# Patient Record
Sex: Male | Born: 1960 | Race: Black or African American | Hispanic: No | Marital: Single | Smoking: Never smoker
Health system: Southern US, Community
[De-identification: ages and names within clinical notes are randomized; demographics above are authoritative.]

## PROBLEM LIST (undated history)

## (undated) DIAGNOSIS — C61 Malignant neoplasm of prostate: Secondary | ICD-10-CM

## (undated) DIAGNOSIS — F1010 Alcohol abuse, uncomplicated: Secondary | ICD-10-CM

## (undated) DIAGNOSIS — C801 Malignant (primary) neoplasm, unspecified: Secondary | ICD-10-CM

## (undated) HISTORY — PX: APPENDECTOMY: SHX54

## (undated) HISTORY — PX: PROSTATECTOMY: SHX69

---

## 2018-11-17 ENCOUNTER — Other Ambulatory Visit: Payer: Self-pay | Admitting: *Deleted

## 2018-11-17 DIAGNOSIS — Z20822 Contact with and (suspected) exposure to covid-19: Secondary | ICD-10-CM

## 2018-11-19 LAB — NOVEL CORONAVIRUS, NAA: SARS-CoV-2, NAA: NOT DETECTED

## 2019-01-15 ENCOUNTER — Other Ambulatory Visit: Payer: Self-pay

## 2019-01-15 DIAGNOSIS — Z20822 Contact with and (suspected) exposure to covid-19: Secondary | ICD-10-CM

## 2019-01-16 LAB — NOVEL CORONAVIRUS, NAA: SARS-CoV-2, NAA: NOT DETECTED

## 2020-10-12 DIAGNOSIS — H5203 Hypermetropia, bilateral: Secondary | ICD-10-CM | POA: Diagnosis not present

## 2021-05-07 ENCOUNTER — Emergency Department (HOSPITAL_COMMUNITY)
Admission: EM | Admit: 2021-05-07 | Discharge: 2021-05-07 | Disposition: A | Discharge status: Home / Self Care | Payer: No Typology Code available for payment source | Attending: Emergency Medicine | Admitting: Emergency Medicine

## 2021-05-07 ENCOUNTER — Other Ambulatory Visit: Payer: Self-pay

## 2021-05-07 ENCOUNTER — Encounter (HOSPITAL_COMMUNITY): Payer: Self-pay

## 2021-05-07 DIAGNOSIS — Z859 Personal history of malignant neoplasm, unspecified: Secondary | ICD-10-CM | POA: Insufficient documentation

## 2021-05-07 DIAGNOSIS — S61216A Laceration without foreign body of right little finger without damage to nail, initial encounter: Secondary | ICD-10-CM

## 2021-05-07 DIAGNOSIS — Z23 Encounter for immunization: Secondary | ICD-10-CM | POA: Insufficient documentation

## 2021-05-07 DIAGNOSIS — Y93E5 Activity, floor mopping and cleaning: Secondary | ICD-10-CM | POA: Diagnosis not present

## 2021-05-07 DIAGNOSIS — W268XXA Contact with other sharp object(s), not elsewhere classified, initial encounter: Secondary | ICD-10-CM | POA: Diagnosis not present

## 2021-05-07 DIAGNOSIS — Y990 Civilian activity done for income or pay: Secondary | ICD-10-CM | POA: Diagnosis not present

## 2021-05-07 HISTORY — DX: Malignant (primary) neoplasm, unspecified: C80.1

## 2021-05-07 MED ORDER — LIDOCAINE HCL (PF) 1 % IJ SOLN
5.0000 mL | Freq: Once | INTRAMUSCULAR | Status: AC
  Administered 2021-05-07: 17:00:00 5 mL
  Filled 2021-05-07: qty 30

## 2021-05-07 MED ORDER — TETANUS-DIPHTH-ACELL PERTUSSIS 5-2.5-18.5 LF-MCG/0.5 IM SUSY
0.5000 mL | PREFILLED_SYRINGE | Freq: Once | INTRAMUSCULAR | Status: AC
  Administered 2021-05-07: 16:00:00 0.5 mL via INTRAMUSCULAR
  Filled 2021-05-07: qty 0.5

## 2021-05-07 MED ORDER — CEPHALEXIN 500 MG PO CAPS: 500.0000 mg | ORAL_CAPSULE | Freq: Four times a day (QID) | ORAL | 0 refills | Status: DC

## 2021-05-07 NOTE — ED Triage Notes (Signed)
Patient reports that he was using a scraper and dropped a the scraper and then he tried catching it and received a laceration to the right pinky. Bleeding controlled.

## 2021-05-07 NOTE — ED Provider Notes (Signed)
Emergency Medicine Provider Triage Evaluation Note  Kyle Chang , a 60 y.o. male  was evaluated in triage.  Pt complains of right pinky laceration that occurred at around 2 PM today.  Review of Systems  Positive: Laceration Negative:   Physical Exam  BP (!) 193/90 (BP Location: Left Arm)   Pulse (!) 110   Temp 98.9 F (37.2 C) (Oral)   Resp 18   Ht 5\' 8"  (1.727 m)   Wt 117.9 kg   SpO2 99%   BMI 39.53 kg/m  Gen:   Awake, no distress   Resp:  Normal effort  MSK:   Moves extremities without difficulty  Other:  Laceration distal to lateral right PIP joint on right pinky patient with full range of motion in all digits on right hand, sensation intact  Medical Decision Making  Medically screening exam initiated at 3:23 PM.  Appropriate orders placed.  Kyle Chang was informed that the remainder of the evaluation will be completed by another provider, this initial triage assessment does not replace that evaluation, and the importance of remaining in the ED until their evaluation is complete.     Evlyn Courier, PA-C 05/07/21 1525    Pattricia Boss, MD 05/07/21 [phone]

## 2021-05-07 NOTE — ED Provider Notes (Signed)
[facility] DEPT Provider Note   CSN: [id number] Arrival date & time: 05/07/21  1430     History Chief Complaint  Patient presents with   Laceration    Kyle Chang is a 60 y.o. male.  HPI Patient is a 60 year old male who presents to the emergency department due to a laceration to the right pinky.  This occurred just prior to arrival.  Patient states that he was using a sharp floor scraper to clean a spot of dried glue and grabbed the scraper blade and accidentally cut the finger.  No active bleeding at this time.  Denies any numbness or weakness.  Unsure of the timing of his last Tdap.    Past Medical History:  Diagnosis Date   Cancer ([place])     There are no problems to display for this patient.   Past Surgical History:  Procedure Laterality Date   APPENDECTOMY     PROSTATECTOMY         Family History  Problem Relation Age of Onset   Cerebral aneurysm Mother    Cancer Father     Social History   Tobacco Use   Smoking status: Never   Smokeless tobacco: Never  Vaping Use   Vaping Use: Never used  Substance Use Topics   Alcohol use: Yes   Drug use: Never    Home Medications Prior to Admission medications   Medication Sig Start Date End Date Taking? Authorizing Provider  cephALEXin (KEFLEX) 500 MG capsule Take 1 capsule (500 mg total) by mouth 4 (four) times daily. 05/07/21  Yes Rayna Sexton, PA-C    Allergies    Patient has no known allergies.  Review of Systems   Review of Systems  Musculoskeletal:  Positive for arthralgias and myalgias.  Skin:  Positive for wound.  Neurological:  Negative for weakness and numbness.   Physical Exam Updated Vital Signs BP (!) 171/98   Pulse 93   Temp 98.9 F (37.2 C) (Oral)   Resp 16   Ht 5\' 8"  (1.727 m)   Wt 117.9 kg   SpO2 100%   BMI 39.53 kg/m   Physical Exam Vitals and nursing note reviewed.  Constitutional:      General: He is not in acute distress.     Appearance: He is well-developed.  HENT:     Head: Normocephalic and atraumatic.     Right Ear: External ear normal.     Left Ear: External ear normal.  Eyes:     General: No scleral icterus.       Right eye: No discharge.        Left eye: No discharge.     Conjunctiva/sclera: Conjunctivae normal.  Neck:     Trachea: No tracheal deviation.  Cardiovascular:     Rate and Rhythm: Normal rate.  Pulmonary:     Effort: Pulmonary effort is normal. No respiratory distress.     Breath sounds: No stridor.  Abdominal:     General: There is no distension.  Musculoskeletal:        General: No swelling or deformity.     Cervical back: Neck supple.  Skin:    General: Skin is warm and dry.     Findings: No rash.     Comments: 2.5 cm well approximated avulsion laceration noted to the palmar aspect of the right pinky.  No active bleeding.  Good cap refill in the finger.  Distal sensation intact.  2+ radial pulses.  Full range of  motion of the finger.  Neurological:     Mental Status: He is alert.     Cranial Nerves: Cranial nerve deficit: no gross deficits.   ED Results / Procedures / Treatments   Labs (all labs ordered are listed, but only abnormal results are displayed) Labs Reviewed - No data to display  EKG None  Radiology No results found.  Procedures .Marland KitchenLaceration Repair  Date/Time: 05/07/2021 5:44 PM Performed by: Rayna Sexton, PA-C Authorized by: Rayna Sexton, PA-C   Consent:    Consent obtained:  Verbal   Consent given by:  Patient   Risks discussed:  Infection, need for additional repair, pain, poor cosmetic result and poor wound healing   Alternatives discussed:  No treatment and delayed treatment Universal protocol:    Procedure explained and questions answered to patient or proxy's satisfaction: yes     Relevant documents present and verified: yes     Test results available: yes     Imaging studies available: yes     Required blood products, implants,  devices, and special equipment available: yes     Site/side marked: yes     Immediately prior to procedure, a time out was called: yes     Patient identity confirmed:  Verbally with patient Anesthesia:    Anesthesia method:  Local infiltration Laceration details:    Location:  Finger   Finger location:  R small finger   Length (cm):  2.5 Pre-procedure details:    Preparation:  Patient was prepped and draped in usual sterile fashion Exploration:    Imaging outcome: foreign body not noted     Wound exploration: wound explored through full range of motion     Contaminated: no   Treatment:    Area cleansed with:  Soap and water, Shur-Clens and povidone-iodine   Amount of cleaning:  Extensive   Irrigation method:  Pressure wash Skin repair:    Repair method:  Sutures   Suture size:  4-0   Suture material:  Prolene   Suture technique:  Simple interrupted   Number of sutures:  3 Approximation:    Approximation:  Close Repair type:    Repair type:  Simple Post-procedure details:    Dressing:  Non-adherent dressing   Procedure completion:  Tolerated well, no immediate complications   Medications Ordered in ED Medications  Tdap (BOOSTRIX) injection 0.5 mL (0.5 mLs Intramuscular Given 05/07/21 1602)  lidocaine (PF) (XYLOCAINE) 1 % injection 5 mL (5 mLs Infiltration Given 05/07/21 1651)   ED Course  I have reviewed the triage vital signs and the nursing notes.  Pertinent labs & imaging results that were available during my care of the patient were reviewed by me and considered in my medical decision making (see chart for details).    MDM Rules/Calculators/A&P                          Pt is a 60 y.o. male who presents to the emergency department due to a laceration to the right pinky.  Patient with a 2.5 cm avulsion laceration to the right pinky.  No active bleeding.  Neurovascularly intact distal to the injury.  Full range of motion of the finger.  Does not appear to be any  tendon involvement.  Wound explored through full range of motion and cleaned extensively.  3, 4-0 Prolene sutures were placed.  Patient tolerated the procedure well.  Please see procedure note above for additional details.  Feel the  patient is stable for discharge at this time and he is agreeable.  Given the nature of the injury will discharge on a course of Keflex to help prevent infection.  Discussed wound care.  Suture removal in 7 to 10 days.  His questions were answered and he was amicable at the time of discharge.  Note: Portions of this report may have been transcribed using voice recognition software. Every effort was made to ensure accuracy; however, inadvertent computerized transcription errors may be present.   Final Clinical Impression(s) / ED Diagnoses Final diagnoses:  Laceration of right little finger without foreign body without damage to nail, initial encounter   Rx / DC Orders ED Discharge Orders          Ordered    cephALEXin (KEFLEX) 500 MG capsule  4 times daily        05/07/21 1738             Rayna Sexton, PA-C 05/07/21 1748    Fredia Sorrow, MD 05/10/21 [phone]

## 2021-05-07 NOTE — Discharge Instructions (Addendum)
Like we discussed, please continue to apply antibiotic ointment to the wound 1-2 times per day.  Please have your stitches removed in 7 to 10 days.  I am prescribing you an antibiotic called Keflex.  Please take this 4 times a day for the next 5 days.  Take it with a small amount of food to prevent stomach irritation.  Do not stop taking it early.  If you develop any new or worsening swelling, pain, redness, fevers, vomiting, discharge from the wound, please come back to the emergency department immediately.

## 2021-07-05 ENCOUNTER — Inpatient Hospital Stay (HOSPITAL_COMMUNITY)
Admission: EM | Admit: 2021-07-05 | Discharge: 2021-07-06 | DRG: 065 | Disposition: A | Discharge status: Home / Self Care | Payer: BC Managed Care – PPO | Attending: Internal Medicine | Admitting: Internal Medicine

## 2021-07-05 ENCOUNTER — Encounter (HOSPITAL_COMMUNITY): Payer: Self-pay

## 2021-07-05 ENCOUNTER — Emergency Department (HOSPITAL_COMMUNITY): Payer: BC Managed Care – PPO

## 2021-07-05 ENCOUNTER — Inpatient Hospital Stay (HOSPITAL_COMMUNITY): Payer: BC Managed Care – PPO

## 2021-07-05 ENCOUNTER — Other Ambulatory Visit: Payer: Self-pay

## 2021-07-05 DIAGNOSIS — I10 Essential (primary) hypertension: Secondary | ICD-10-CM | POA: Diagnosis present

## 2021-07-05 DIAGNOSIS — Z800 Family history of malignant neoplasm of digestive organs: Secondary | ICD-10-CM

## 2021-07-05 DIAGNOSIS — I63532 Cerebral infarction due to unspecified occlusion or stenosis of left posterior cerebral artery: Principal | ICD-10-CM | POA: Diagnosis present

## 2021-07-05 DIAGNOSIS — Z8546 Personal history of malignant neoplasm of prostate: Secondary | ICD-10-CM | POA: Diagnosis not present

## 2021-07-05 DIAGNOSIS — G8191 Hemiplegia, unspecified affecting right dominant side: Secondary | ICD-10-CM | POA: Diagnosis not present

## 2021-07-05 DIAGNOSIS — E669 Obesity, unspecified: Secondary | ICD-10-CM | POA: Diagnosis present

## 2021-07-05 DIAGNOSIS — R29701 NIHSS score 1: Secondary | ICD-10-CM | POA: Diagnosis not present

## 2021-07-05 DIAGNOSIS — I6381 Other cerebral infarction due to occlusion or stenosis of small artery: Secondary | ICD-10-CM | POA: Diagnosis present

## 2021-07-05 DIAGNOSIS — N400 Enlarged prostate without lower urinary tract symptoms: Secondary | ICD-10-CM | POA: Diagnosis present

## 2021-07-05 DIAGNOSIS — D164 Benign neoplasm of bones of skull and face: Secondary | ICD-10-CM | POA: Diagnosis not present

## 2021-07-05 DIAGNOSIS — I639 Cerebral infarction, unspecified: Secondary | ICD-10-CM

## 2021-07-05 DIAGNOSIS — F1010 Alcohol abuse, uncomplicated: Secondary | ICD-10-CM | POA: Diagnosis present

## 2021-07-05 DIAGNOSIS — Z20822 Contact with and (suspected) exposure to covid-19: Secondary | ICD-10-CM | POA: Diagnosis not present

## 2021-07-05 DIAGNOSIS — Z6838 Body mass index (BMI) 38.0-38.9, adult: Secondary | ICD-10-CM | POA: Diagnosis not present

## 2021-07-05 DIAGNOSIS — R29818 Other symptoms and signs involving the nervous system: Secondary | ICD-10-CM | POA: Diagnosis not present

## 2021-07-05 DIAGNOSIS — R200 Anesthesia of skin: Secondary | ICD-10-CM | POA: Diagnosis not present

## 2021-07-05 DIAGNOSIS — Z9079 Acquired absence of other genital organ(s): Secondary | ICD-10-CM

## 2021-07-05 DIAGNOSIS — Z79899 Other long term (current) drug therapy: Secondary | ICD-10-CM

## 2021-07-05 DIAGNOSIS — E7800 Pure hypercholesterolemia, unspecified: Secondary | ICD-10-CM | POA: Diagnosis not present

## 2021-07-05 DIAGNOSIS — E785 Hyperlipidemia, unspecified: Secondary | ICD-10-CM | POA: Diagnosis present

## 2021-07-05 HISTORY — DX: Alcohol abuse, uncomplicated: F10.10

## 2021-07-05 HISTORY — DX: Malignant neoplasm of prostate: C61

## 2021-07-05 HISTORY — DX: Obesity, unspecified: E66.9

## 2021-07-05 LAB — CBC
HCT: 40.4 % (ref 39.0–52.0)
Hemoglobin: 13.5 g/dL (ref 13.0–17.0)
MCH: 29.7 pg (ref 26.0–34.0)
MCHC: 33.4 g/dL (ref 30.0–36.0)
MCV: 88.8 fL (ref 80.0–100.0)
Platelets: 286 10*3/uL (ref 150–400)
RBC: 4.55 MIL/uL (ref 4.22–5.81)
RDW: 13.4 % (ref 11.5–15.5)
WBC: 5.7 10*3/uL (ref 4.0–10.5)
nRBC: 0 % (ref 0.0–0.2)

## 2021-07-05 LAB — ETHANOL: Alcohol, Ethyl (B): <10 mg/dL (ref <10)

## 2021-07-05 LAB — COMPREHENSIVE METABOLIC PANEL
ALT: 16 U/L (ref 0–44)
AST: 16 U/L (ref 15–41)
Albumin: 3.7 g/dL (ref 3.5–5.0)
Alkaline Phosphatase: 103 U/L (ref 38–126)
Anion gap: 8 (ref 5–15)
BUN: 10 mg/dL (ref 8–23)
CO2: 27 mmol/L (ref 22–32)
Calcium: 8.4 mg/dL — ABNORMAL LOW (ref 8.9–10.3)
Chloride: 102 mmol/L (ref 98–111)
Creatinine, Ser: 0.67 mg/dL (ref 0.61–1.24)
GFR, Estimated: >60 mL/min (ref >60)
Glucose, Bld: 108 mg/dL — ABNORMAL HIGH (ref 70–99)
Potassium: 3.7 mmol/L (ref 3.5–5.1)
Sodium: 137 mmol/L (ref 135–145)
Total Bilirubin: 0.9 mg/dL (ref 0.3–1.2)
Total Protein: 7.3 g/dL (ref 6.5–8.1)

## 2021-07-05 LAB — RAPID URINE DRUG SCREEN, HOSP PERFORMED
Amphetamines: NOT DETECTED
Barbiturates: NOT DETECTED
Benzodiazepines: NOT DETECTED
Cocaine: NOT DETECTED
Opiates: NOT DETECTED
Tetrahydrocannabinol: NOT DETECTED

## 2021-07-05 LAB — TSH: TSH: 0.864 u[IU]/mL (ref 0.350–4.500)

## 2021-07-05 LAB — URINALYSIS, ROUTINE W REFLEX MICROSCOPIC
Bilirubin Urine: NEGATIVE
Glucose, UA: NEGATIVE mg/dL
Hgb urine dipstick: NEGATIVE
Ketones, ur: NEGATIVE mg/dL
Leukocytes,Ua: NEGATIVE
Nitrite: NEGATIVE
Protein, ur: NEGATIVE mg/dL
Specific Gravity, Urine: 1.013 (ref 1.005–1.030)
pH: 6 (ref 5.0–8.0)

## 2021-07-05 LAB — DIFFERENTIAL
Abs Immature Granulocytes: 0.02 10*3/uL (ref 0.00–0.07)
Basophils Absolute: 0 10*3/uL (ref 0.0–0.1)
Basophils Relative: 0 %
Eosinophils Absolute: 0.1 10*3/uL (ref 0.0–0.5)
Eosinophils Relative: 2 %
Immature Granulocytes: 0 %
Lymphocytes Relative: 31 %
Lymphs Abs: 1.8 10*3/uL (ref 0.7–4.0)
Monocytes Absolute: 0.4 10*3/uL (ref 0.1–1.0)
Monocytes Relative: 7 %
Neutro Abs: 3.5 10*3/uL (ref 1.7–7.7)
Neutrophils Relative %: 60 %

## 2021-07-05 LAB — I-STAT CHEM 8, ED
BUN: 10 mg/dL (ref 8–23)
Calcium, Ion: 1.11 mmol/L — ABNORMAL LOW (ref 1.15–1.40)
Chloride: 102 mmol/L (ref 98–111)
Creatinine, Ser: 0.8 mg/dL (ref 0.61–1.24)
Glucose, Bld: 106 mg/dL — ABNORMAL HIGH (ref 70–99)
HCT: 40 % (ref 39.0–52.0)
Hemoglobin: 13.6 g/dL (ref 13.0–17.0)
Potassium: 4.3 mmol/L (ref 3.5–5.1)
Sodium: 139 mmol/L (ref 135–145)
TCO2: 29 mmol/L (ref 22–32)

## 2021-07-05 LAB — PROTIME-INR
INR: 1 (ref 0.8–1.2)
Prothrombin Time: 13.3 seconds (ref 11.4–15.2)

## 2021-07-05 LAB — PHOSPHORUS: Phosphorus: 2.9 mg/dL (ref 2.5–4.6)

## 2021-07-05 LAB — APTT: aPTT: 27 seconds (ref 24–36)

## 2021-07-05 LAB — RESP PANEL BY RT-PCR (FLU A&B, COVID) ARPGX2
Influenza A by PCR: NEGATIVE
Influenza B by PCR: NEGATIVE
SARS Coronavirus 2 by RT PCR: NEGATIVE

## 2021-07-05 LAB — MAGNESIUM: Magnesium: 2 mg/dL (ref 1.7–2.4)

## 2021-07-05 IMAGING — MR MR MRA HEAD W/O CM
1 series · 19 of 48 positions shown · non-contrast
Comparison: MRI earlier same day

CLINICAL DATA: Neuro deficit, acute, stroke suspected. Acute left
thalamic infarction by previous MRI.

EXAM:
MRA HEAD WITHOUT CONTRAST
TECHNIQUE: Angiographic images of the Circle of Willis were acquired using MRA
technique without intravenous contrast.

[Series 8: TOF · axial · 0.6mm · 0.35mm/px · z∈[-25,+70]mm · 19 of 172 slices shown]
[im 1/172]
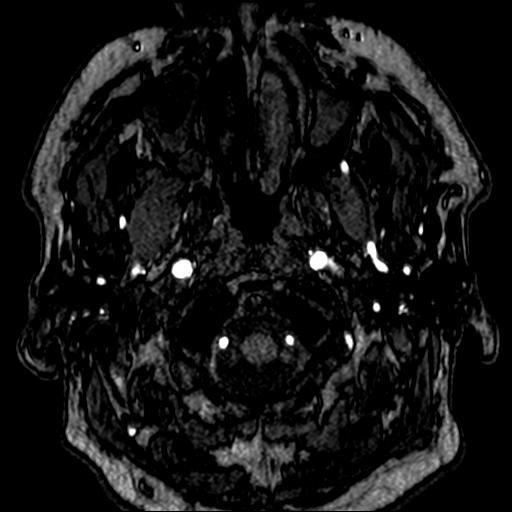
[im 4/172]
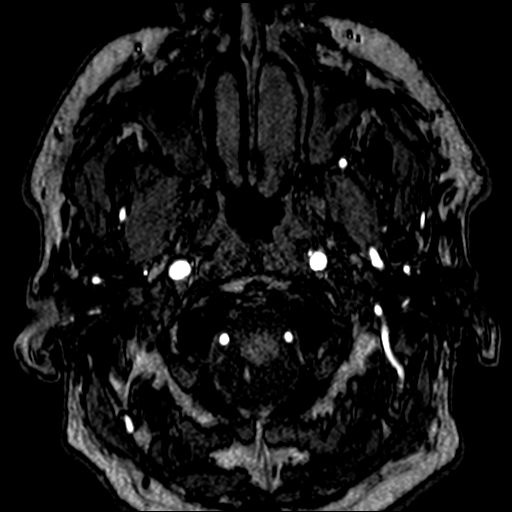
[im 8/172]
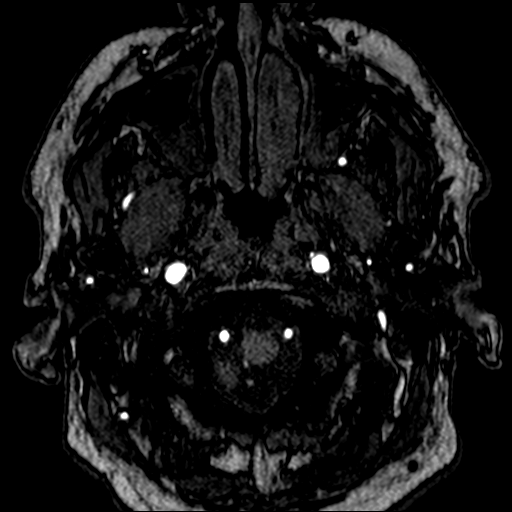
[im 11/172]
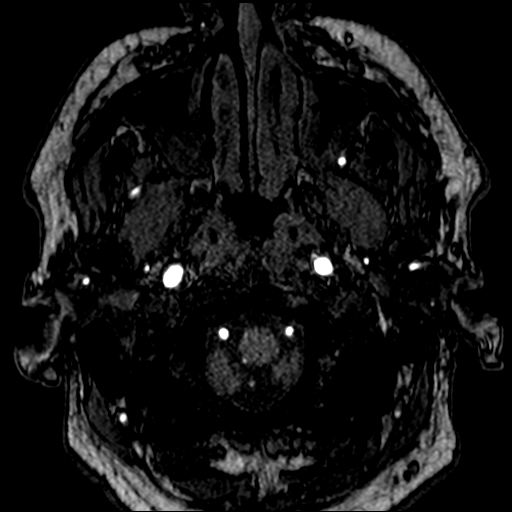
[im 15/172]
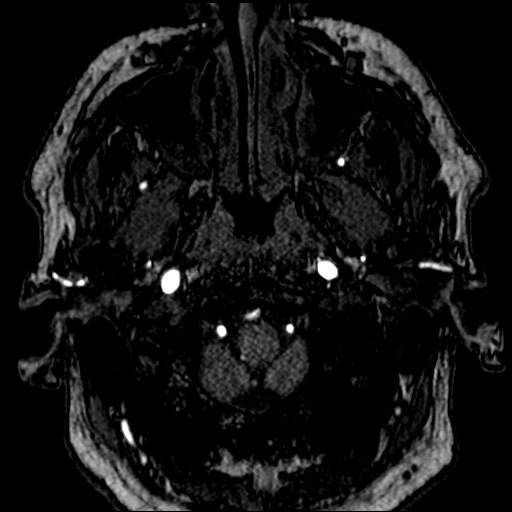
[im 19/172]
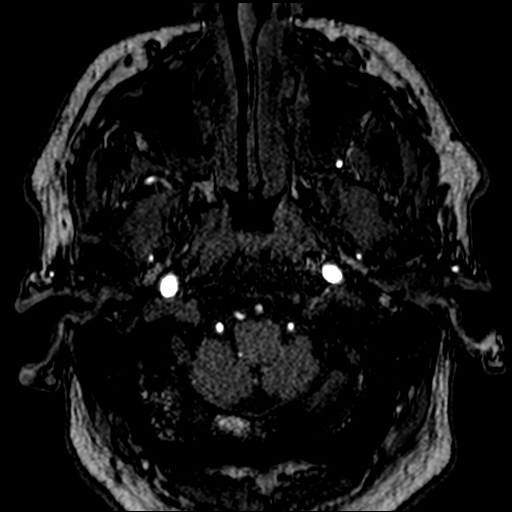
[im 22/172]
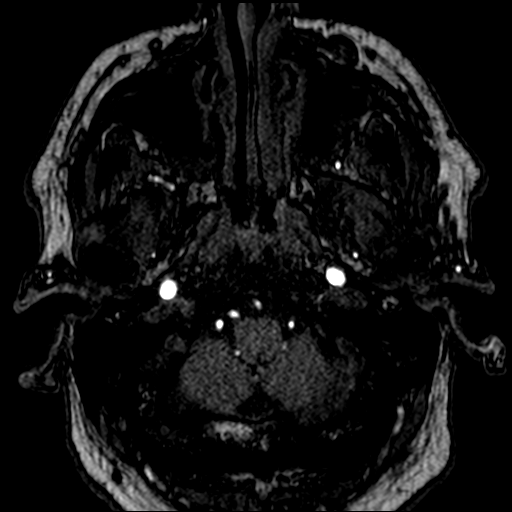
[im 26/172]
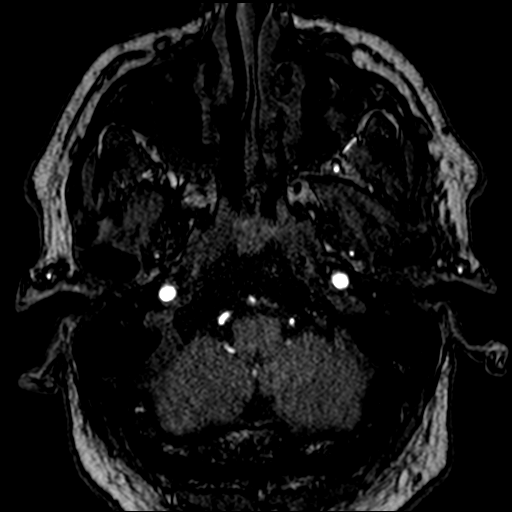
[im 30/172]
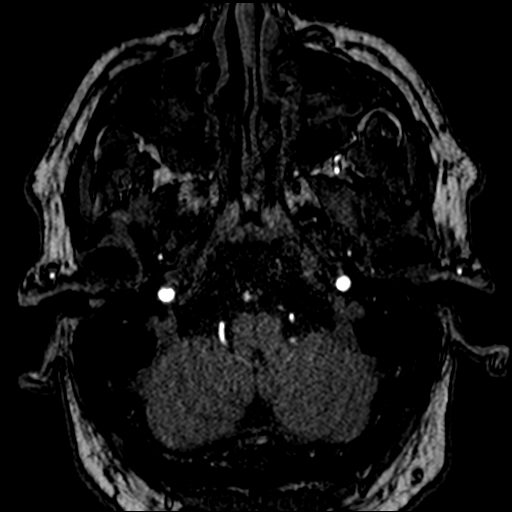
[im 33/172]
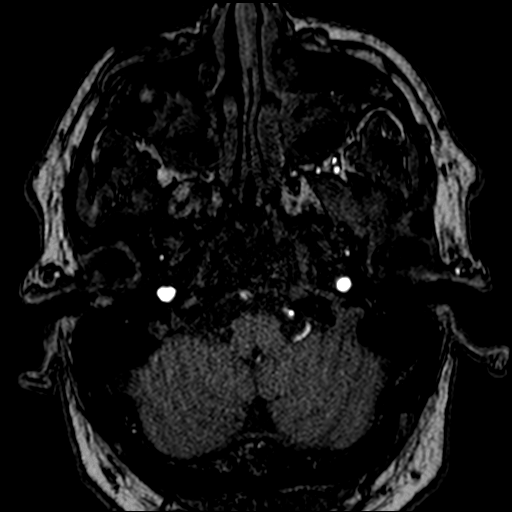
[im 37/172]
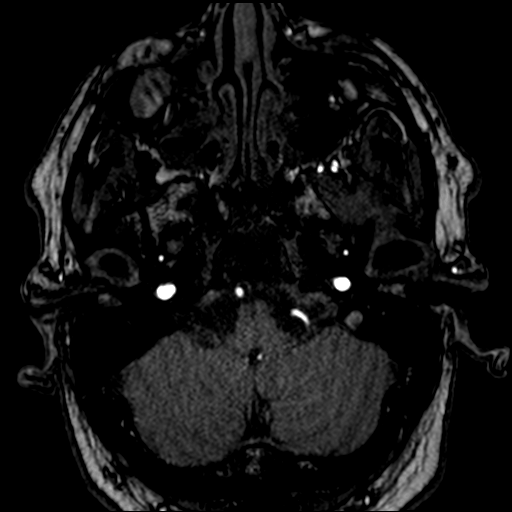
[im 55/172]
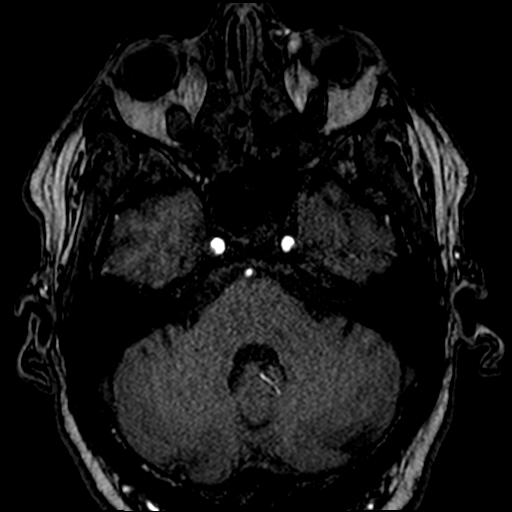
[im 77/172]
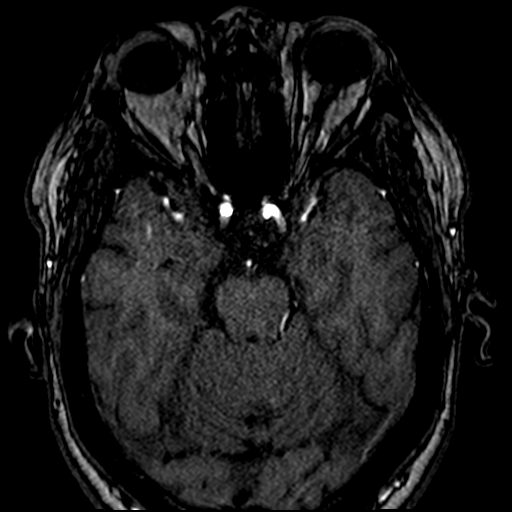
[im 88/172]
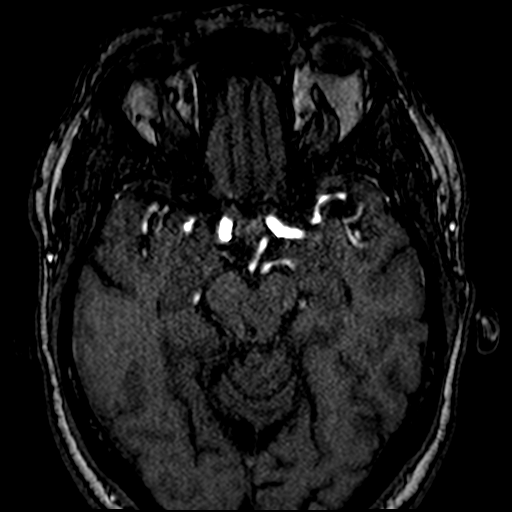
[im 99/172]
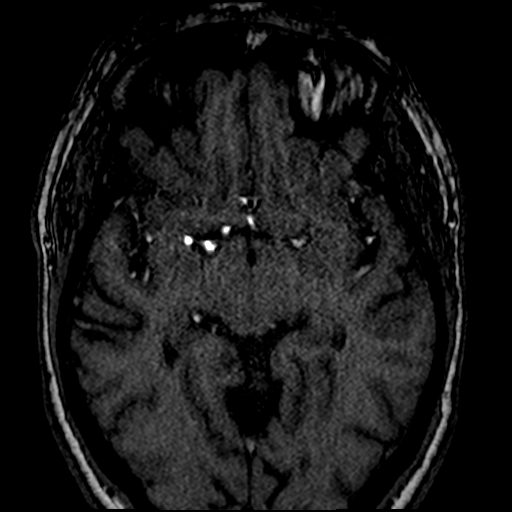
[im 121/172]
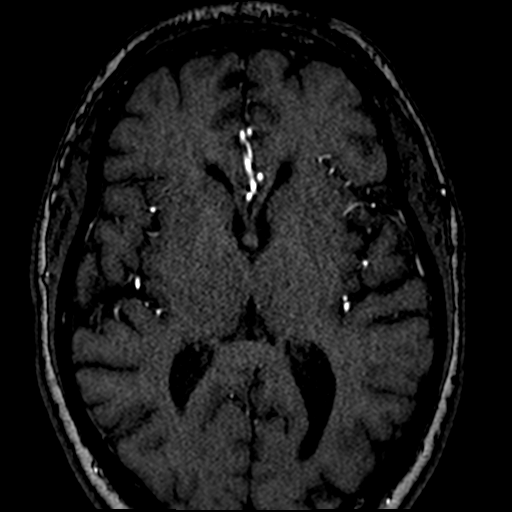
[im 142/172]
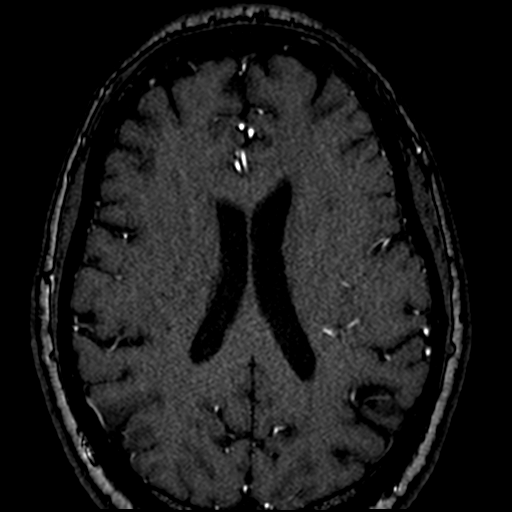
[im 146/172]
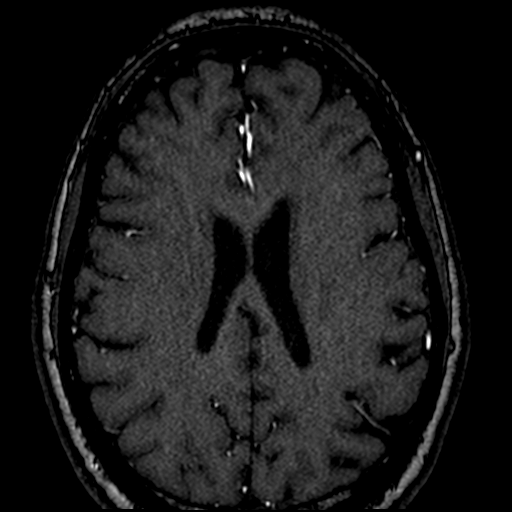
[im 164/172]
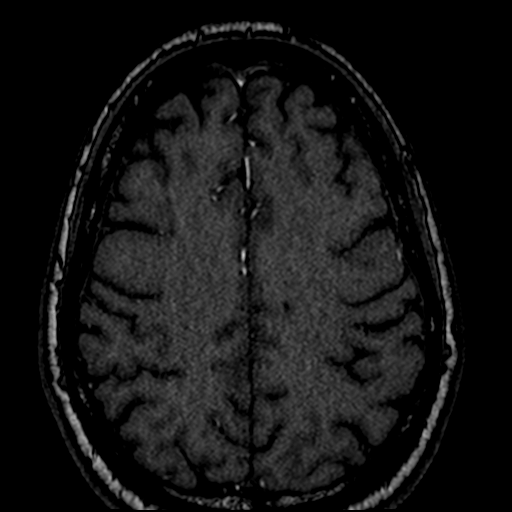

[19 of 48 positions shown; findings below may reference images not displayed]

FINDINGS: Anterior circulation: Both internal carotid arteries are patent
through the skull base and siphon regions. The anterior and middle
cerebral vessels are patent. No large vessel occlusion or
correctable proximal stenosis. No aneurysm or vascular malformation.
Fetal origin right PCA.

Posterior circulation: Both vertebral arteries are patent through
the foramen magnum and supply their respective PICA. Beyond that the
right vertebral close on to supply the basilar artery. Left
vertebral artery is severely disease or occluded distal to PICA. The
basilar artery is patent. Superior cerebellar and posterior cerebral
arteries are patent, right PCA showing fetal origin as discussed
above. Left PCA shows diminished filling of the distal branches.

Anatomic variants: None other significant.

Other: None.
IMPRESSION: No anterior circulation pathology seen.

Left vertebral artery supplies PICA but is abnormal distal to that,
either severely disease or occluded. Right vertebral artery supplies
the basilar. No significant basilar stenosis. Left PCA is abnormal
with diminished filling of the more distal branch vessels. Right PCA
is a large vessel it takes fetal origin from the anterior
circulation.

## 2021-07-05 IMAGING — MR MR HEAD W/O CM
10 series · 48 of 48 positions shown · non-contrast
Comparison: Head CT [DATE]

CLINICAL DATA: Neuro deficit, acute, stroke suspected. Right arm
and leg numbness.

EXAM:
MRI HEAD WITHOUT CONTRAST
TECHNIQUE: Multiplanar, multiecho pulse sequences of the brain and surrounding
structures were obtained without intravenous contrast.

[Series 5: DWI · axial · 3.0mm · 1.36mm/px · z∈[-26,+126]mm · 7 of 104 slices shown (1 of 2)]
[im 1/104]
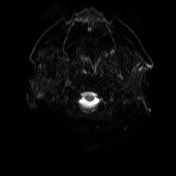
[im 18/104]
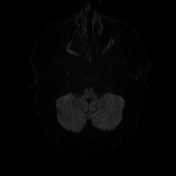
[im 35/104]
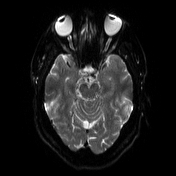
[im 52/104]
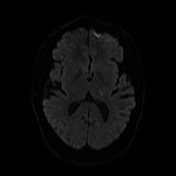
[im 69/104]
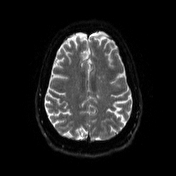
[im 86/104]
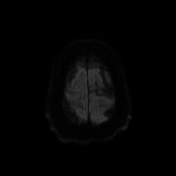
[im 104/104]
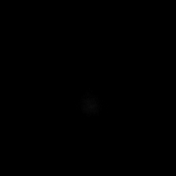

[Series 6: DWI · axial · 3.0mm · 1.36mm/px · z∈[-26,+126]mm · 4 of 52 slices shown (2 of 2)]
[im 1/52]
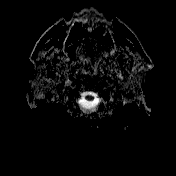
[im 18/52]
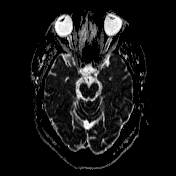
[im 35/52]
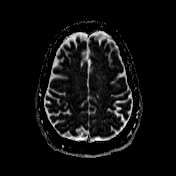
[im 52/52]
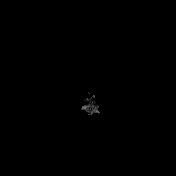

[Series 7: T1 · sagittal · 5.0mm · 0.75mm/px · 2 of 24 slices shown (1 of 2)]
[im 1/24]
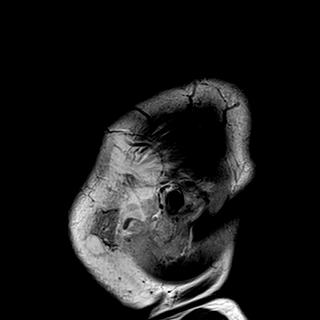
[im 24/24]
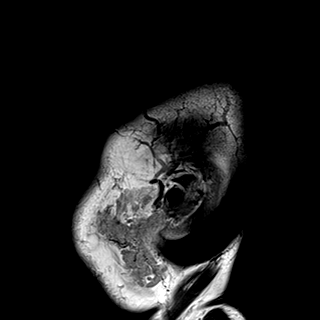

[Series 8: T2 · axial · 5.0mm · 0.62mm/px · z∈[-46,+129]mm · 2 of 28 slices shown (1 of 2)]
[im 1/28]
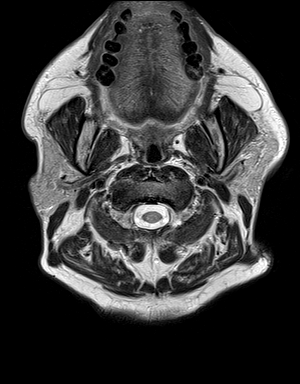
[im 28/28]
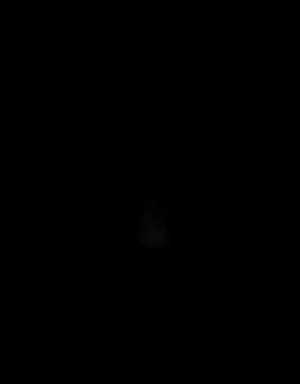

[Series 9: swi_images · axial · 3.0mm · 0.75mm/px · z∈[-47,+130]mm · 5 of 60 slices shown]
[im 1/60]
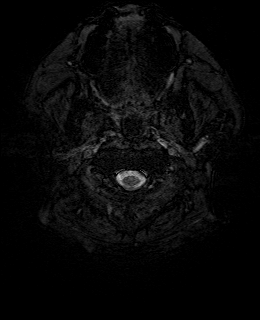
[im 15/60]
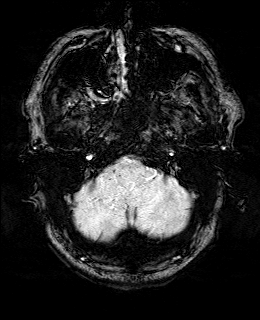
[im 30/60]
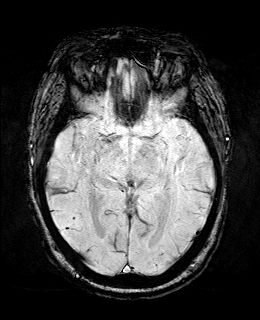
[im 45/60]
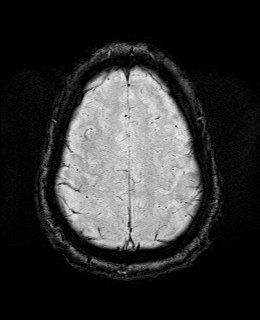
[im 60/60]
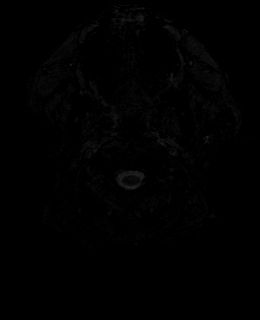

[Series 11: FLAIR · axial · 3.0mm · 0.75mm/px · z∈[-43,+127]mm · 5 of 58 slices shown]
[im 1/58]
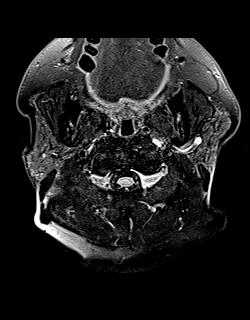
[im 15/58]
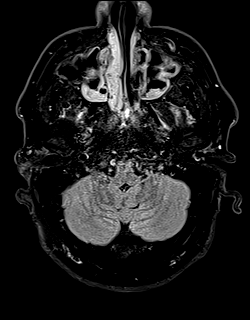
[im 29/58]
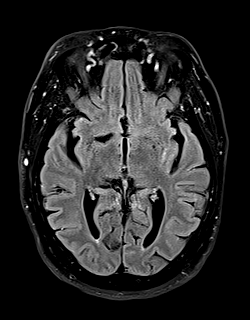
[im 43/58]
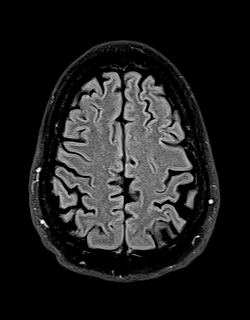
[im 58/58]
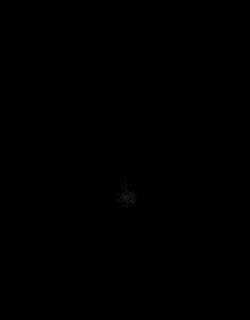

[Series 12: T1 · axial · 1.0mm · 0.94mm/px · z∈[-41,+134]mm · 14 of 174 slices shown (2 of 2)]
[im 1/174]
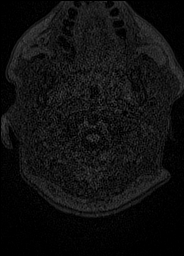
[im 14/174]
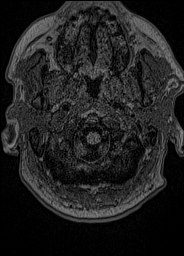
[im 27/174]
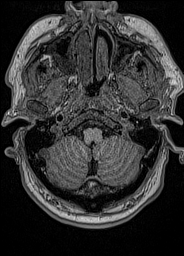
[im 40/174]
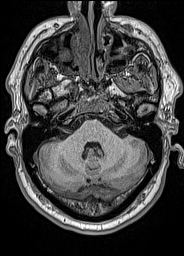
[im 54/174]
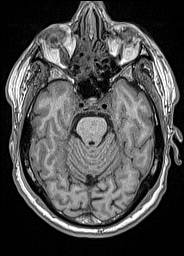
[im 67/174]
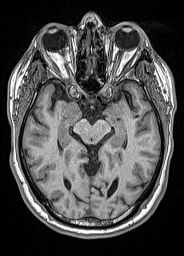
[im 80/174]
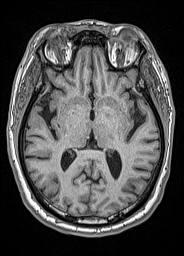
[im 94/174]
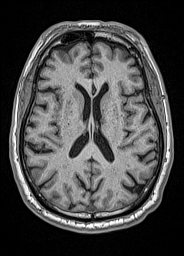
[im 107/174]
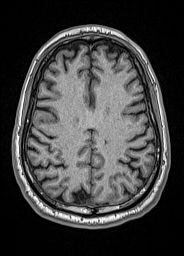
[im 120/174]
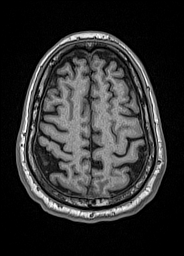
[im 134/174]
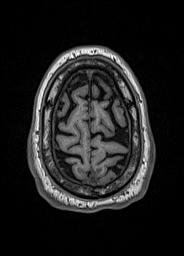
[im 147/174]
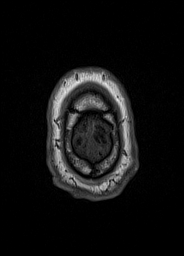
[im 160/174]
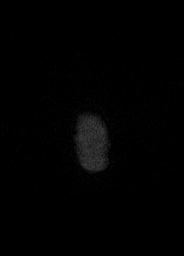
[im 174/174]
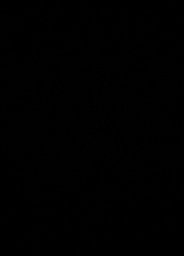

[Series 13: cor dwi_tracew · coronal · 5.0mm · 1.53mm/px · 4 of 54 slices shown]
[im 1/54]
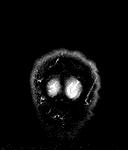
[im 18/54]
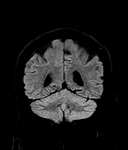
[im 36/54]
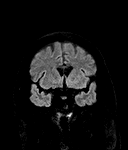
[im 54/54]
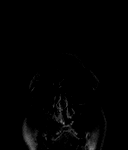

[Series 14: cor dwi_adc · coronal · 5.0mm · 1.53mm/px · 2 of 27 slices shown]
[im 1/27]
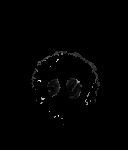
[im 27/27]
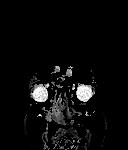

[Series 15: T2 · coronal · 5.0mm · 0.57mm/px · 3 of 36 slices shown (2 of 2)]
[im 1/36]
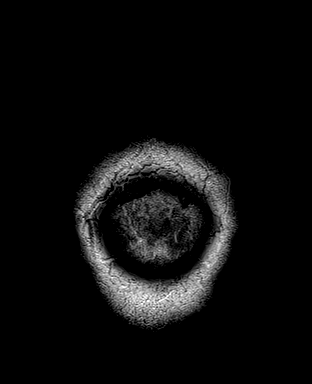
[im 18/36]
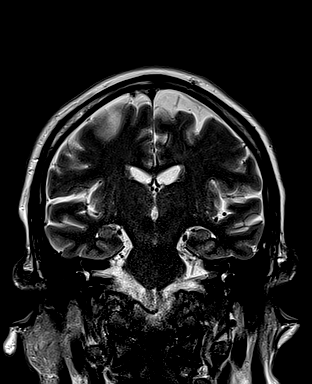
[im 36/36]
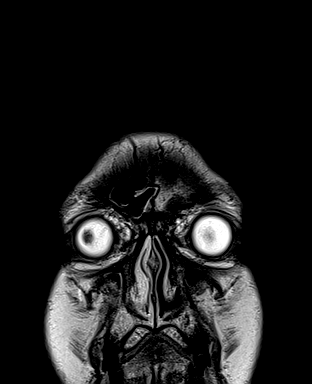

[48 of 48 positions shown; findings below may reference images not displayed]

FINDINGS: Brain: There is a 9 mm acute infarct in the lateral aspect of the
left thalamus. Small T2 hyperintensities in the cerebral white
matter bilaterally are nonspecific but compatible with mild chronic
small vessel ischemic disease. There is mild cerebral atrophy. No
intracranial hemorrhage, mass, midline shift, or extra-axial fluid
collection is identified.

Vascular: Major intracranial vascular flow voids are preserved.

Skull and upper cervical spine: Mild nonspecific diffuse bone marrow
heterogeneity. No destructive skull lesion.

Sinuses/Orbits: Unremarkable orbits. Moderate mucosal thickening in
the paranasal sinuses with fluid in the maxillary sinuses. Clear
mastoid air cells.

Other: None.
IMPRESSION: 1. Acute left thalamic infarct.
2. Mild chronic small vessel ischemic disease.
3.  Cerebral atrophy ([ZS]-[ZS]).

## 2021-07-05 IMAGING — CT CT HEAD W/O CM
3 series · 14 of 47 positions shown, 16 images · non-contrast
Comparison: None.

CLINICAL DATA: Right arm and leg numbness upon awakening this
morning.



[Series 2: head wo · axial · 0.47mm/px · z∈[-81,+54]mm · 8 of 33 slices shown, 10 images]
[im 3/33  brain]
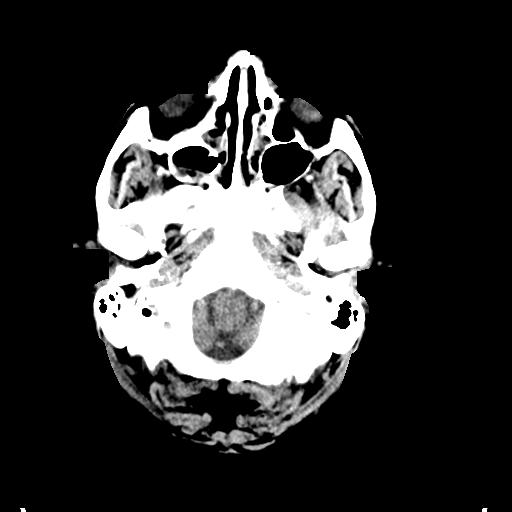
[im 3/33  bone]
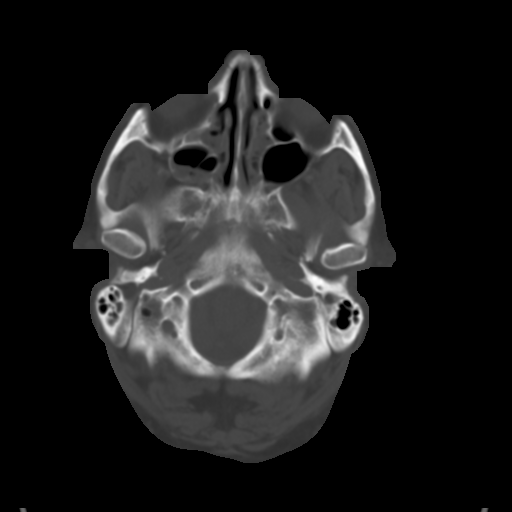
[im 7/33  brain]
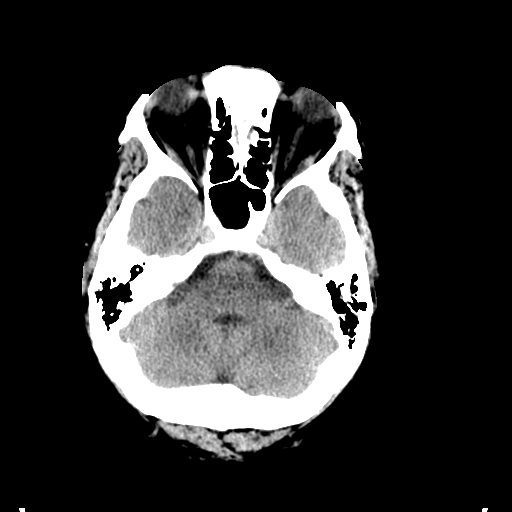
[im 10/33  brain]
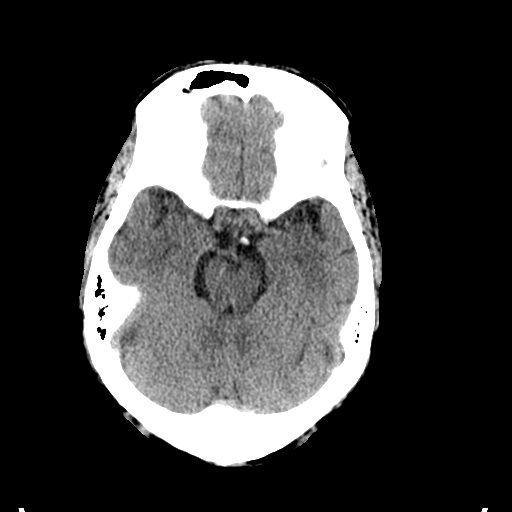
[im 15/33  brain]
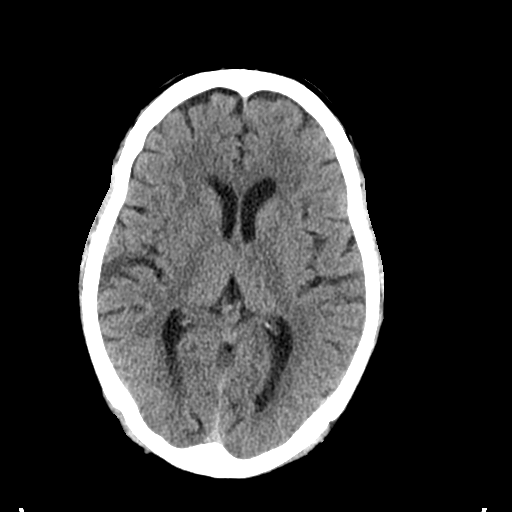
[im 18/33  brain]
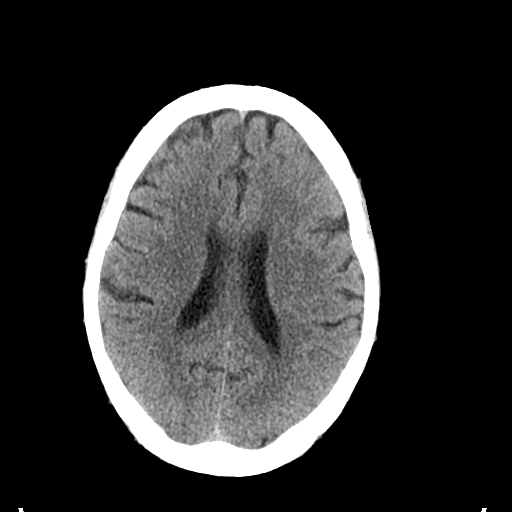
[im 18/33  bone]
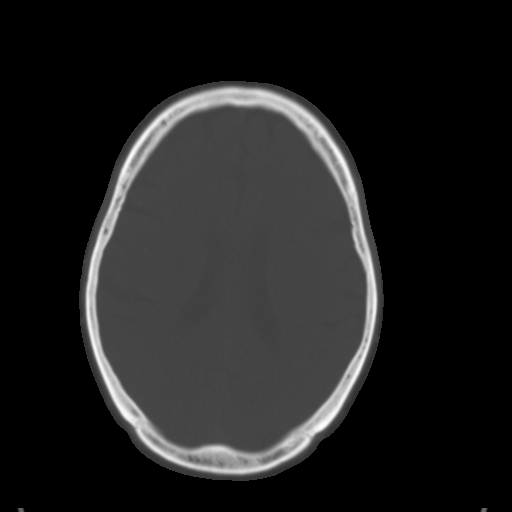
[im 23/33  brain]
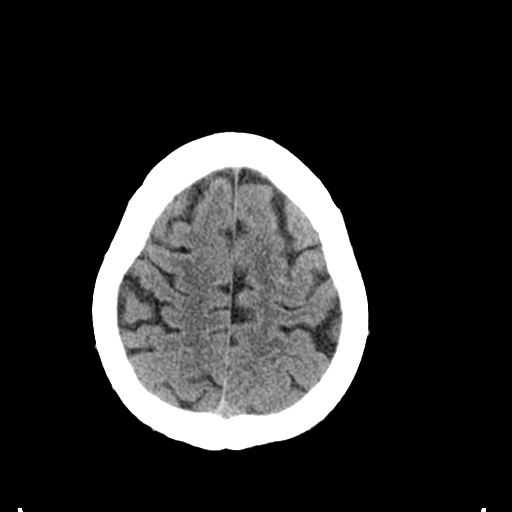
[im 26/33  brain]
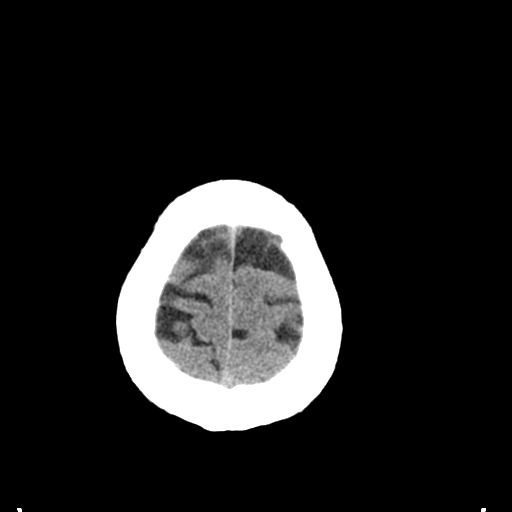
[im 30/33  brain]
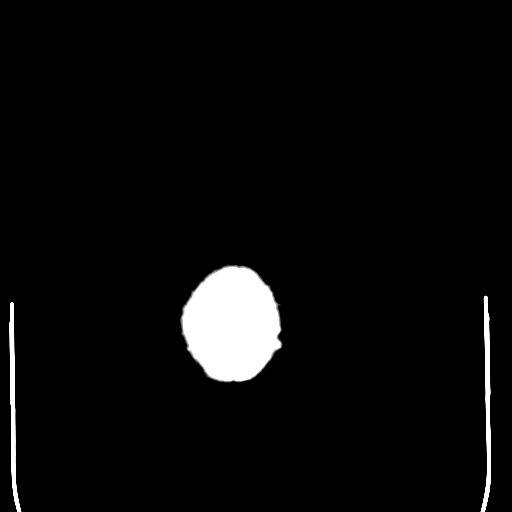

[Series 5: sagittal soft tissue · sagittal · 0.32mm/px · 3 of 71 slices shown]
[im 24/71  brain]
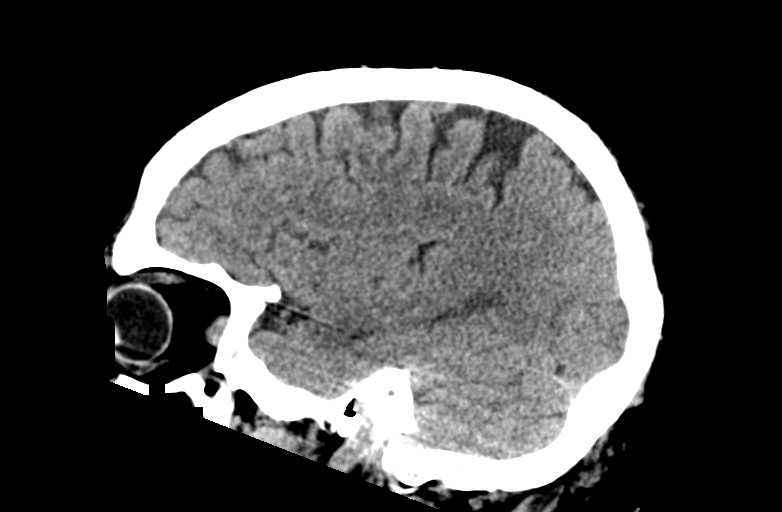
[im 36/71  brain]
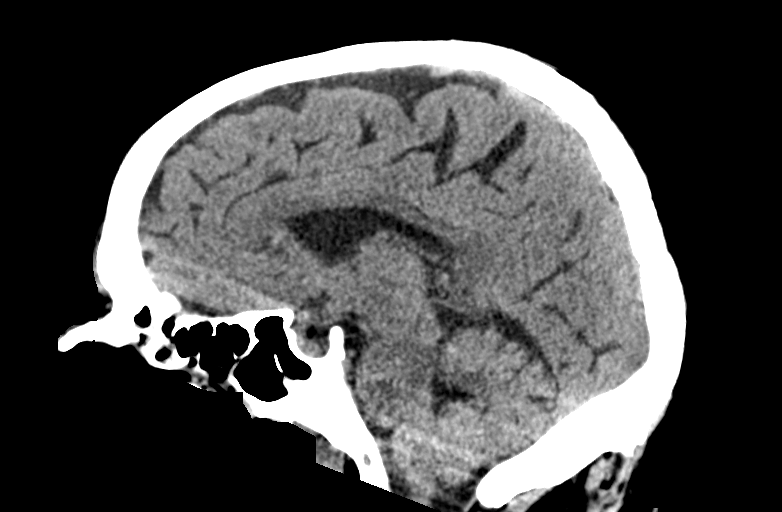
[im 47/71  brain]
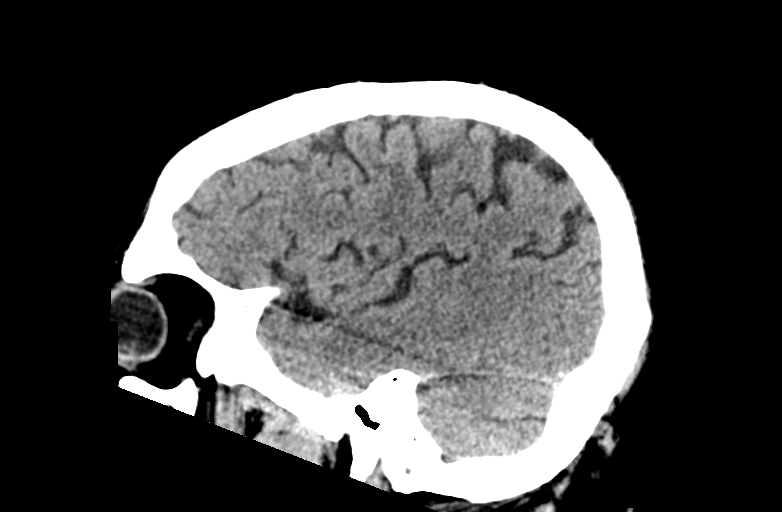

[Series 6: coronal soft tissue · coronal · 0.32mm/px · 3 of 85 slices shown]
[im 29/85  brain]
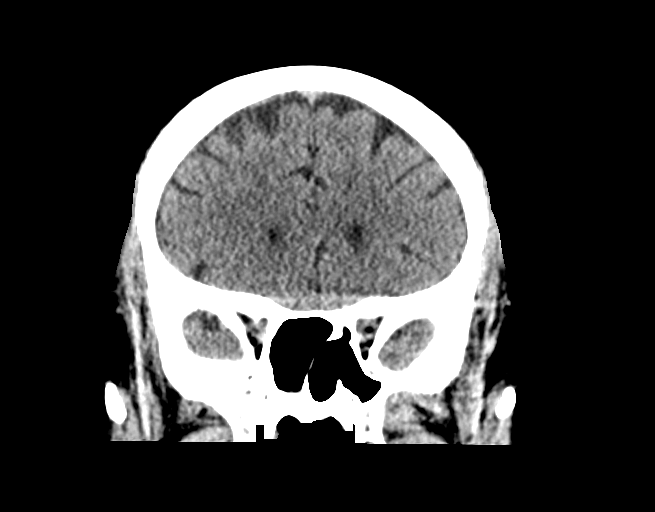
[im 38/85  brain]
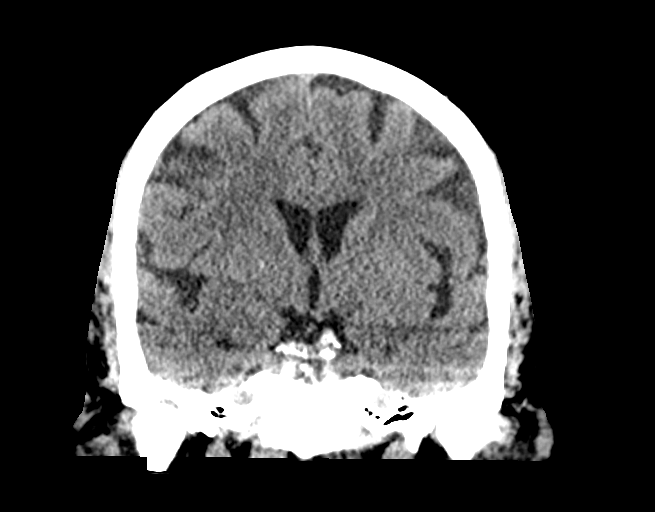
[im 47/85  brain]
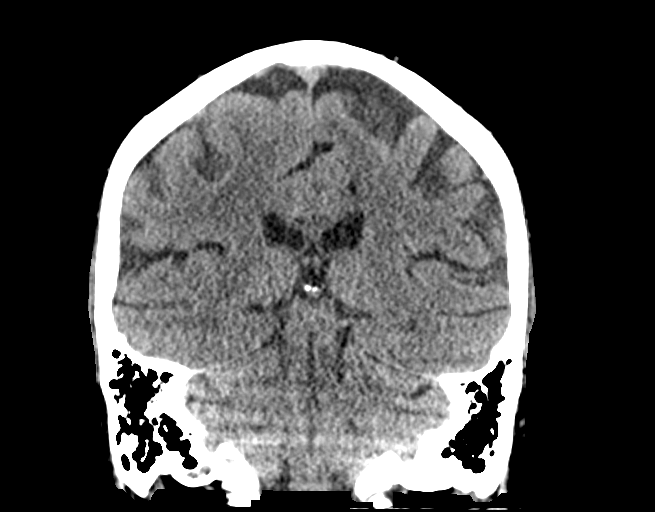

[14 of 47 positions shown; findings below may reference images not displayed]

FINDINGS: Brain: The brainstem, cerebellum, cerebral peduncles, thalami, basal
ganglia, basilar cisterns, and ventricular system appear within
normal limits. No intracranial hemorrhage, mass lesion, or acute
CVA.

Vascular: Unremarkable

Skull: 0.8 by 0.6 cm primarily sclerotic lesion in the left frontal
bone on image 10 series 3, with rim sclerosis and central sclerosis
but a band of lucency along the margins, probably a small osteoma or
similar benign lesion as this is not really characteristic of
prostate metastatic disease this patient with history of
prostatectomy. This is not felt to be related to the patient's acute
symptoms.

Sinuses/Orbits: Chronic left and acute on chronic right maxillary
sinusitis with chronic bilateral ethmoid and sphenoid sinusitis.

Other: No supplemental non-categorized findings.
IMPRESSION: 1. Chronic paranasal sinusitis potentially with superimposed acute
right maxillary sinusitis.
2. No acute intracranial findings.
3. Small mixed density but mostly sclerotic lesion in the left
frontal bone is probably an osteoma, a sclerotic prostate metastatic
lesion is considered less likely given the characteristics of the
lesion.

## 2021-07-05 MED ORDER — ENOXAPARIN SODIUM 40 MG/0.4ML IJ SOSY: 40.0000 mg | PREFILLED_SYRINGE | INTRAMUSCULAR | Status: DC

## 2021-07-05 MED ORDER — MAGNESIUM SULFATE 2 GM/50ML IV SOLN
2.0000 g | Freq: Once | INTRAVENOUS | Status: AC
  Administered 2021-07-05: 2 g via INTRAVENOUS
  Filled 2021-07-05: qty 50

## 2021-07-05 MED ORDER — CLOPIDOGREL BISULFATE 75 MG PO TABS
75.0000 mg | ORAL_TABLET | Freq: Every day | ORAL | Status: DC
  Administered 2021-07-05 – 2021-07-06 (×2): 75 mg via ORAL
  Filled 2021-07-05 (×3): qty 1

## 2021-07-05 MED ORDER — FOLIC ACID 1 MG PO TABS
1.0000 mg | ORAL_TABLET | Freq: Every day | ORAL | Status: DC
  Administered 2021-07-05 – 2021-07-06 (×2): 1 mg via ORAL
  Filled 2021-07-05 (×2): qty 1

## 2021-07-05 MED ORDER — LORAZEPAM 2 MG/ML IJ SOLN: 1.0000 mg | INTRAMUSCULAR | Status: DC | PRN

## 2021-07-05 MED ORDER — LORAZEPAM 1 MG PO TABS: 1.0000 mg | ORAL_TABLET | ORAL | Status: DC | PRN

## 2021-07-05 MED ORDER — ONDANSETRON HCL 4 MG PO TABS: 4.0000 mg | ORAL_TABLET | Freq: Four times a day (QID) | ORAL | Status: DC | PRN

## 2021-07-05 MED ORDER — THIAMINE HCL 100 MG/ML IJ SOLN: 100.0000 mg | Freq: Every day | INTRAMUSCULAR | Status: DC

## 2021-07-05 MED ORDER — SODIUM CHLORIDE 0.9 % IV SOLN
100.0000 mL/h | INTRAVENOUS | Status: DC
  Administered 2021-07-05: 100 mL/h via INTRAVENOUS

## 2021-07-05 MED ORDER — LORAZEPAM 2 MG/ML IJ SOLN: 0.0000 mg | Freq: Four times a day (QID) | INTRAMUSCULAR | Status: DC

## 2021-07-05 MED ORDER — THIAMINE HCL 100 MG/ML IJ SOLN: 250.0000 mg | Freq: Every day | INTRAVENOUS | Status: DC

## 2021-07-05 MED ORDER — ONDANSETRON HCL 4 MG/2ML IJ SOLN: 4.0000 mg | Freq: Four times a day (QID) | INTRAMUSCULAR | Status: DC | PRN

## 2021-07-05 MED ORDER — THIAMINE HCL 100 MG/ML IJ SOLN
500.0000 mg | Freq: Three times a day (TID) | INTRAVENOUS | Status: DC
  Administered 2021-07-05 – 2021-07-06 (×3): 500 mg via INTRAVENOUS
  Filled 2021-07-05 (×6): qty 5

## 2021-07-05 MED ORDER — POTASSIUM CHLORIDE IN NACL 20-0.9 MEQ/L-% IV SOLN
INTRAVENOUS | Status: AC
  Filled 2021-07-05: qty 1000

## 2021-07-05 MED ORDER — ACETAMINOPHEN 325 MG PO TABS: 650.0000 mg | ORAL_TABLET | Freq: Four times a day (QID) | ORAL | Status: DC | PRN

## 2021-07-05 MED ORDER — STROKE: EARLY STAGES OF RECOVERY BOOK
Freq: Once | Status: AC
  Filled 2021-07-05 (×2): qty 1

## 2021-07-05 MED ORDER — ASPIRIN EC 81 MG PO TBEC
81.0000 mg | DELAYED_RELEASE_TABLET | Freq: Every day | ORAL | Status: DC
  Administered 2021-07-05 – 2021-07-06 (×2): 81 mg via ORAL
  Filled 2021-07-05 (×2): qty 1

## 2021-07-05 MED ORDER — LORAZEPAM 2 MG/ML IJ SOLN: 0.0000 mg | Freq: Two times a day (BID) | INTRAMUSCULAR | Status: DC

## 2021-07-05 MED ORDER — ADULT MULTIVITAMIN W/MINERALS CH
1.0000 | ORAL_TABLET | Freq: Every day | ORAL | Status: DC
  Administered 2021-07-05 – 2021-07-06 (×2): 1 via ORAL
  Filled 2021-07-05 (×2): qty 1

## 2021-07-05 MED ORDER — SODIUM CHLORIDE 0.9 % IV BOLUS
500.0000 mL | Freq: Once | INTRAVENOUS | Status: AC
  Administered 2021-07-05: 500 mL via INTRAVENOUS

## 2021-07-05 MED ORDER — ACETAMINOPHEN 650 MG RE SUPP: 650.0000 mg | Freq: Four times a day (QID) | RECTAL | Status: DC | PRN

## 2021-07-05 MED ORDER — THIAMINE HCL 100 MG PO TABS
100.0000 mg | ORAL_TABLET | Freq: Every day | ORAL | Status: DC
  Administered 2021-07-05: 100 mg via ORAL
  Filled 2021-07-05: qty 1

## 2021-07-05 NOTE — Consult Note (Addendum)
Neurology consult   CC: Right sided numbness  Referring MD: Abundio Miu., MD.   History is obtained from: Patient, chart  HPI: [name] is a 61 yo male with a PMHx of ETOH abuse, BPH, HTN, OA,  and prostate cancer s/p robotic prostatectomy with negative margins in 2018 treated at [facility]. Patient presented to [facility] ED with c/o numbness to right arm and leg. Patient went to bed last pm at 2230 hours in his normal state of health. He woke up this morning at 0400 and he did not notice any symptoms until he woke up again around 0600 hours. He got up and walked around the house and drove himself to work. He said he was clumsy with the gas pedal as not knowing how hard he was pushing.    No weakness of a particular extremity, dysphagia, dysarthria, facial droop, HA or change in vision. His MRI brain showed a left thalamic stroke. He was outside the window for TNK. Workup thus far reveals a normal carotid vascular US, echocardiogram is pending, ETOH < 10,  Neg UDS and MRA head is pending.    No history of stroke. On care everywhere, it is noted that patient was drinking 45 drinks per week at that time. NP told him that and he said no way I drink that much, but does admit to daily drinking. Last night, he had 2 beers and 4 liquor drinks. He is shy about sharing the real amount.   Neurology asked to see due to new finding of stroke on MRI brain.   LKW:  2230 hours 07/04/21         TNK given?: No, OOW.  IR Thrombectomy?: Low suspicion for LVO, awaiting MRA head results.  MRS:0  NIHSS:  1a Level of Consciousness: 0 1b LOC Questions: 0 1c LOC Commands: 0 2 Best Gaze: 0 3 Visual: 0 4 Facial Palsy: 0 5a Motor Arm - left: 0 5b Motor Arm - Right: 0 6a Motor Leg - Left: 0 6b Motor Leg - Right: 0 7 Limb Ataxia: 0 8 Sensory: 1 9 Best Language: 0 10 Dysarthria: 0 11 Extinction and Inattention: 0 TOTAL:  1  ROS: Robust ROS negative except as noted in HPI.    Past Medical History:   Diagnosis Date   Alcohol abuse    Cancer ([place])    Class 2 obesity 07/05/2021   Prostate cancer [facility])     Family History  Problem Relation Age of Onset   Cerebral aneurysm Mother    Cancer Father    Stomach cancer Brother    Pulmonary embolism Brother    Social History:  reports that he has never smoked. He has never used smokeless tobacco. He reports current alcohol use. He reports that he does not use drugs.   Prior to Admission medications   Medication Sig Start Date End Date Taking? Authorizing Provider  Misc Natural Products (TUMERSAID) TABS Take 1 tablet by mouth daily.   Yes [provider]  cephALEXin (KEFLEX) 500 MG capsule Take 1 capsule (500 mg total) by mouth 4 (four) times daily. Patient not taking: Reported on 07/05/2021 05/07/21   Rayna Sexton, PA-C    Exam: Current vital signs: BP (!) 189/96    Pulse 80    Temp 98.6 F (37 C) (Oral)    Resp 20    Ht 5\' 8"  (1.727 m)    Wt 115.7 kg    SpO2 100%    BMI 38.77 kg/m  Physical Exam  Constitutional: Appears well-developed and well-nourished. Obese. Sitting on side of ED bed talking and laughing.  Psych: Affect appropriate to situation. Eyes: No scleral injection. HENT: No OP obstruction. Head: Normocephalic.  Cardiovascular: Normal rate and regular rhythm.  Respiratory: Effort normal.  GI: Abdomen soft.  No distension. There is no tenderness.  Skin: WDI.  Neuro: Mental Status: Patient is awake, alert, oriented to person, place, month, year, and situation. Patient is able to give a clear and coherent history. No signs of neglect. Speech/Language:  Speech is clear, fluent without dysarthria or aphasia. Repetition, naming, and comprehension intact.  Cranial Nerves: II: Visual Fields are full. Pupils are equal, round, and reactive to light.  III,IV, VI: EOMI without ptosis or diploplia.   V: Facial sensation is symmetric to light touch in V1, V2, and V3 VII: Facial movement symmetrical. No  droop. VIII: hearing is intact to voice. X: Uvula elevates symmetrically. XI: Shoulder shrug is symmetric. XII: tongue is midline without atrophy or fasciculations.  Motor: 5/5 throughout. Sensory: Sensation is intact to light touch, but on the lateral portion of thigh and tibia he states he can not feel it as much as left. His LUE is same as LUE.   Plantars: Toes are downgoing bilaterally.  Cerebellar: No ataxia noted with FNF and HKS bilaterally.   I have reviewed labs in epic and the pertinent results are: INR   1      aPTT   13.3       creatinine  0.8  NCT head  -Chronic paranasal sinusitis potentially with superimposed acute right maxillary sinusitis. -No acute intracranial findings. -Small mixed density but mostly sclerotic lesion in the left frontal bone is probably an osteoma, a sclerotic prostate metastatic lesion is considered less likely given the characteristics of the lesion.  MRI brain -Acute left thalamic infarct. -Mild chronic small vessel ischemic disease. -Cerebral atrophy (ICD10-G31.9).  MRA head Pending  Vascular carotid US Right Carotid: The extracranial vessels were near-normal with only minimal wall thickening or plaque.  Left Carotid: The extracranial vessels were near-normal with only minimal wall thickening or plaque. Vertebrals:  Bilateral vertebral arteries demonstrate antegrade flow.  Subclavians: Normal flow hemodynamics were seen in bilateral subclavian arteries.   Assessment: 61 yo male with stroke risk factors of HTN, sedentary lifestyle, and chronic small vessel ischemic disease. CTH negative for stroke. MRI brain positive for right thalamic stroke which is consistent with patient's symptoms. MRA is pending and if atherosclerosis, he will need 90 days of Plavix, otherwise, 21 days. Given ETOH abuse and unknown drinks per week, give high dose Thiamine.   Impression:  -Acute left thalamic infarct.  -ETOH abuse with ? # of drinks per week.   -HTN.   Plan: - Medicine admit.  -Transfer to [facility] for rest of stroke w/up.  -Thiamine high dose then 100mg  po bid on discharge.  - TTE is pending.  - HbA1c, lipid panel, TSH. - Recommend high intensity statin or increased dose if LDL > 70. A1c goal is < 7%.  -Allow permissive HTN up to 220/110 x 2-3 days, then normalize. -Goal BP is 130/90. - Aspirin 81mg  daily. - Clopidogrel 75mg  daily for 21 days or 90 days if  atherosclerosis on on MRA head.  - Telemetry monitoring for arrhythmia. - bedside Swallow screen. - Stroke education. - PT/OT/SLP consult. - NIHSS as per protocol. - frequent neuro checks.  -ETOH abuse counseling.  -stroke team to follow.   Patient seen by Clance Boll,  MSN, APN-BC, nurse practitioner and by MD. Note/plan to be edited by MD as needed.  Pager: [phone]   Attending Neurohospitalist Addendum Patient seen and examined with APP/Resident. Agree with the history and physical as documented above. Agree with the plan as documented, which I helped formulate. I have independently reviewed the chart, obtained history, review of systems and examined the patient.I have personally reviewed pertinent head/neck/spine imaging (CT/MRI). Please feel free to call with any questions.  -- Amie Portland, MD Neurologist [facility] Pager: [phone]

## 2021-07-05 NOTE — Progress Notes (Signed)
Carotid duplex has been completed.  Results can be found under chart review under CV PROC. 07/05/2021 4:33 PM Bernetha Anschutz RVT, RDMS

## 2021-07-05 NOTE — ED Notes (Signed)
Patient passed stroke swallow screen.

## 2021-07-05 NOTE — ED Provider Notes (Signed)
Kyle Chang   CSN: [id number] Arrival date & time: 07/05/21  0934     History  Chief Complaint  Patient presents with   Numbness    Kyle Chang is a 61 y.o. male.  HPI   Pt states when he went to bed last night he felt fine.  That was around 1030pm.  This morning when he woke up his right arm and leg felt numb.  It also felt somewhat weak.  He was having trouble with the gas pedal when driving.  No trouble with speech.   No headache.  Vision ok.  Pt denies any prior history like this.  Pt does not have a history of htn as far as he knows.  He has not seen a primary care doctor in a while.  No smoking.  Pt does drink daily, 2 beers per day  Home Medications Prior to Admission medications   Medication Sig Start Date End Date Taking? Authorizing Provider  cephALEXin (KEFLEX) 500 MG capsule Take 1 capsule (500 mg total) by mouth 4 (four) times daily. 05/07/21   Rayna Sexton, PA-C      Allergies    Patient has no known allergies.    Review of Systems   Review of Systems  All other systems reviewed and are negative.  Physical Exam Updated Vital Signs BP (!) 149/92 (BP Location: Left Arm)    Pulse 84    Temp 98.6 F (37 C) (Oral)    Resp 20    Ht 1.727 m (5\' 8" )    Wt 115.7 kg    SpO2 100%    BMI 38.77 kg/m  Physical Exam Vitals and nursing Chang reviewed.  Constitutional:      General: He is not in acute distress.    Appearance: He is well-developed.  HENT:     Head: Normocephalic and atraumatic.     Right Ear: External ear normal.     Left Ear: External ear normal.  Eyes:     General: No scleral icterus.       Right eye: No discharge.        Left eye: No discharge.     Conjunctiva/sclera: Conjunctivae normal.  Neck:     Trachea: No tracheal deviation.  Cardiovascular:     Rate and Rhythm: Normal rate and regular rhythm.  Pulmonary:     Effort: Pulmonary effort is normal. No respiratory distress.     Breath  sounds: Normal breath sounds. No stridor. No wheezing or rales.  Abdominal:     General: Bowel sounds are normal. There is no distension.     Palpations: Abdomen is soft.     Tenderness: There is no abdominal tenderness. There is no guarding or rebound.  Musculoskeletal:        General: No tenderness.     Cervical back: Neck supple.  Skin:    General: Skin is warm and dry.     Findings: No rash.  Neurological:     Mental Status: He is alert and oriented to person, place, and time.     Cranial Nerves: No cranial nerve deficit.     Sensory: No sensory deficit.     Motor: No abnormal muscle tone or seizure activity.     Coordination: Coordination normal.     Comments: No pronator drift bilateral upper extrem, able to hold both legs off bed for 5 seconds, sensation intact in all extremities, no visual field cuts, no left or right  sided neglect, normal finger-nose exam bilaterally, no nystagmus noted  No facial droop, extraocular movements intact, tongue midline    ED Results / Procedures / Treatments   Labs (all labs ordered are listed, but only abnormal results are displayed) Labs Reviewed  COMPREHENSIVE METABOLIC PANEL - Abnormal; Notable for the following components:      Result Value   Glucose, Bld 108 (*)    Calcium 8.4 (*)    All other components within normal limits  I-STAT CHEM 8, ED - Abnormal; Notable for the following components:   Glucose, Bld 106 (*)    Calcium, Ion 1.11 (*)    All other components within normal limits  RESP PANEL BY RT-PCR (FLU A&B, COVID) ARPGX2  ETHANOL  PROTIME-INR  APTT  CBC  DIFFERENTIAL  RAPID URINE DRUG SCREEN, HOSP PERFORMED  URINALYSIS, ROUTINE W REFLEX MICROSCOPIC    EKG None  Radiology CT HEAD WO CONTRAST  Result Date: 07/05/2021 CLINICAL DATA:  Right arm and leg numbness upon awakening this morning. EXAM: CT HEAD WITHOUT CONTRAST TECHNIQUE: Contiguous axial images were obtained from the base of the skull through the vertex  without intravenous contrast. RADIATION DOSE REDUCTION: This exam was performed according to the departmental dose-optimization program which includes automated exposure control, adjustment of the mA and/or kV according to patient size and/or use of iterative reconstruction technique. COMPARISON:  None. FINDINGS: Brain: The brainstem, cerebellum, cerebral peduncles, thalami, basal ganglia, basilar cisterns, and ventricular system appear within normal limits. No intracranial hemorrhage, mass lesion, or acute CVA. Vascular: Unremarkable Skull: 0.8 by 0.6 cm primarily sclerotic lesion in the left frontal bone on image 10 series 3, with rim sclerosis and central sclerosis but a band of lucency along the margins, probably a small osteoma or similar benign lesion as this is not really characteristic of prostate metastatic disease this patient with history of prostatectomy. This is not felt to be related to the patient's acute symptoms. Sinuses/Orbits: Chronic left and acute on chronic right maxillary sinusitis with chronic bilateral ethmoid and sphenoid sinusitis. Other: No supplemental non-categorized findings. IMPRESSION: 1. Chronic paranasal sinusitis potentially with superimposed acute right maxillary sinusitis. 2. No acute intracranial findings. 3. 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 Electronically Signed   By: Van Clines M.D.   On: 07/05/2021 11:36   MR BRAIN WO CONTRAST  Result Date: 07/05/2021 CLINICAL DATA:  Neuro deficit, acute, stroke suspected. Right arm and leg numbness. EXAM: MRI HEAD WITHOUT CONTRAST TECHNIQUE: Multiplanar, multiecho pulse sequences of the brain and surrounding structures were obtained without intravenous contrast. COMPARISON:  Head CT 07/05/2021 FINDINGS: Brain: There is a 9 mm acute infarct in the lateral aspect  of the left thalamus. Small T2 hyperintensities in the cerebral white matter bilaterally are nonspecific but compatible with mild chronic small vessel ischemic disease. There is mild cerebral atrophy. No intracranial hemorrhage, mass, midline shift, or extra-axial fluid collection is identified. Vascular: Major intracranial vascular flow voids are preserved. Skull and upper cervical spine: Mild nonspecific diffuse bone marrow heterogeneity. No destructive skull lesion. Sinuses/Orbits: Unremarkable orbits. Moderate mucosal thickening in the paranasal sinuses with fluid in the maxillary sinuses. Clear mastoid air cells. Other: None. IMPRESSION: 1. Acute left thalamic infarct. 2. Mild chronic small vessel ischemic disease. 3.  Cerebral atrophy (ICD10-G31.9). Electronically Signed   By: Logan Bores M.D.   On: 07/05/2021 14:29  Procedures Procedures    Medications Ordered in ED Medications  sodium chloride 0.9 % bolus 500 mL (0 mLs Intravenous Stopped 07/05/21 1212)    Followed by  0.9 %  sodium chloride infusion (100 mL/hr Intravenous New Bag/Given 07/05/21 1127)    ED Course/ Medical Decision Making/ A&P Clinical Course as of 07/05/21 1536  Thu Jul 05, 2021  1216 Head CT images and radiology report reviewed.  Chronic paranasal sinusitis with possible superimposed acute sinusitis.  No acute head CT findings.  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 Olevia Bowens regarding admission [JK]    Clinical Course User Index [JK] Kyle Rank, MD                           Medical Decision Making Amount and/or Complexity of Data Reviewed Labs: ordered. Radiology: ordered.  Risk Prescription drug management. Decision regarding hospitalization.   Weakness Patient presented with acute weakness and numbness.  Symptoms concerning for the possibility of stroke.  No acute findings noted on CT scan but MRI does confirm a thalamic stroke.  Patient is  outside of any acute tPA window.  He is LVO negative.  I will consult with medical service for admission  Hypertension. Patient denies any history of hypertension but has not been following with a primary care doctor.  No need for acute intervention at this time with his acute stroke.  We will continue to monitor.        Final Clinical Impression(s) / ED Diagnoses Final diagnoses:  Cerebrovascular accident (CVA), unspecified mechanism ([place])     Kyle Rank, MD 07/05/21 1536

## 2021-07-05 NOTE — Plan of Care (Signed)
°  Problem: Education: Goal: Knowledge of disease or condition will improve Outcome: Progressing Goal: Knowledge of secondary prevention will improve (SELECT ALL) Outcome: Progressing Goal: Knowledge of patient specific risk factors will improve (INDIVIDUALIZE FOR PATIENT) Outcome: Progressing   Problem: Coping: Goal: Will verbalize positive feelings about self Outcome: Progressing

## 2021-07-05 NOTE — ED Notes (Signed)
This RN has [facility] to arrange transportation

## 2021-07-05 NOTE — H&P (Addendum)
History and Physical    Kyle Chang UJW:[id number] DOB: 02/11/1961 DOA: 07/05/2021  PCP: Pcp, No   Patient coming from: Home.  I have personally briefly reviewed patient's old medical records in [place]  Chief Complaint: Right-sided weakness.  HPI: Kyle Chang is a 61 y.o. male with medical history significant of alcohol abuse, prostate cancer, history of prostatectomy, class II obesity who is coming to the emergency department due to developing right-sided weakness since this morning which she noticed while he was driving as he was unable to pressed the gas and brake pedals correctly.  No headache, blurred vision, slurred speech, gait imbalance or nausea/emesis.  He denies fever, chills, rhinorrhea, sore throat, wheezing, dyspnea or hemoptysis.  No chest pain, palpitations, diaphoresis, PND, dyspnea or pitting edema lower extremities.  No abdominal pain, diarrhea, constipation, melena or hematochezia.  Denies dysuria, frequency or hematuria.  No polyuria, polydipsia, polyphagia or blurred vision.    ED Course: Initial vital signs were temperature 98.1 F, pulse 108, respiration 18, BP 182/106 mmHg O2 sat 98% on room air.  Lab work: His UDS was negative.  Urinalysis was normal.  CBC was unremarkable with a white count 5.7, hemoglobin 13.5 and platelets 286.  PT, INR and PTT within normal limits.  CMP showed a glucose of 108 and calcium of 8.4 mg/dL, the rest of the measurements were within expected range.  Imaging: CT head without contrast did not show any acute intracranial findings.  MRI of the brain without contrast show an acute left thalamic infarct.  Review of Systems: As per HPI otherwise all other systems reviewed and are negative.  Past Medical History:  Diagnosis Date   Alcohol abuse    Cancer ([place])    Class 2 obesity 07/05/2021   Prostate cancer [facility])     Past Surgical History:  Procedure Laterality Date   APPENDECTOMY     PROSTATECTOMY     PROSTATECTOMY       Social History  reports that he has never smoked. He has never used smokeless tobacco. He reports current alcohol use. He reports that he does not use drugs.  No Known Allergies  Family History  Problem Relation Age of Onset   Cerebral aneurysm Mother    Cancer Father    Stomach cancer Brother    Pulmonary embolism Brother    Prior to Admission medications   Medication Sig Start Date End Date Taking? Authorizing Provider  cephALEXin (KEFLEX) 500 MG capsule Take 1 capsule (500 mg total) by mouth 4 (four) times daily. 05/07/21   Rayna Sexton, PA-C    Physical Exam: Vitals:   07/05/21 0938 07/05/21 1045 07/05/21 1501  BP: (!) 182/106 (!) 157/86 (!) 149/92  Pulse: (!) 108 85 84  Resp: 18 (!) 22 20  Temp: 98.1 F (36.7 C)  98.6 F (37 C)  TempSrc: Oral  Oral  SpO2: 98% 97% 100%  Weight: 115.7 kg    Height: 5\' 8"  (1.727 m)      Constitutional: NAD, calm, comfortable Eyes: PERRL, lids and conjunctivae normal ENMT: Mucous membranes are moist. Posterior pharynx clear of any exudate or lesions. Neck: normal, supple, no masses, no thyromegaly Respiratory: clear to auscultation bilaterally, no wheezing, no crackles. Normal respiratory effort. No accessory muscle use.  Cardiovascular: Regular rate and rhythm, no murmurs / rubs / gallops. No extremity edema. 2+ pedal pulses. No carotid bruits.  Abdomen: Obese, no distention.  Soft, no tenderness, no masses palpated. No hepatosplenomegaly. Bowel sounds positive.  Musculoskeletal:  no clubbing / cyanosis. Good ROM, no contractures. Normal muscle tone.  Skin: no rashes, lesions, ulcers on very limited dermatological examination.  Pain does seem to Neurologic: CN 2-12 grossly intact.  Subjective decrease in sensation on right side extremities,  4/5 right-sided hemiparesis. Psychiatric: Normal judgment and insight. Alert and oriented x 3. Normal mood.   Labs on Admission: I have personally reviewed following labs and imaging  studies  CBC: Recent Labs  Lab 07/05/21 1036 07/05/21 1142  WBC 5.7  --   NEUTROABS 3.5  --   HGB 13.5 13.6  HCT 40.4 40.0  MCV 88.8  --   PLT 286  --     Basic Metabolic Panel: Recent Labs  Lab 07/05/21 1036 07/05/21 1142  NA 137 139  K 3.7 4.3  CL 102 102  CO2 27  --   GLUCOSE 108* 106*  BUN 10 10  CREATININE 0.67 0.80  CALCIUM 8.4*  --     GFR: Estimated Creatinine Clearance: 118.2 mL/min (by C-G formula based on SCr of 0.8 mg/dL).  Liver Function Tests: Recent Labs  Lab 07/05/21 1036  AST 16  ALT 16  ALKPHOS 103  BILITOT 0.9  PROT 7.3  ALBUMIN 3.7    Urine analysis: No results found for: COLORURINE, APPEARANCEUR, LABSPEC, PHURINE, GLUCOSEU, HGBUR, BILIRUBINUR, KETONESUR, PROTEINUR, UROBILINOGEN, NITRITE, LEUKOCYTESUR  Radiological Exams on Admission: CT HEAD WO CONTRAST  Result Date: 07/05/2021 CLINICAL DATA:  Right arm and leg numbness upon awakening this morning. EXAM: CT HEAD WITHOUT CONTRAST TECHNIQUE: Contiguous axial images were obtained from the base of the skull through the vertex without intravenous contrast. RADIATION DOSE REDUCTION: This exam was performed according to the departmental dose-optimization program which includes automated exposure control, adjustment of the mA and/or kV according to patient size and/or use of iterative reconstruction technique. COMPARISON:  None. FINDINGS: Brain: The brainstem, cerebellum, cerebral peduncles, thalami, basal ganglia, basilar cisterns, and ventricular system appear within normal limits. No intracranial hemorrhage, mass lesion, or acute CVA. Vascular: Unremarkable Skull: 0.8 by 0.6 cm primarily sclerotic lesion in the left frontal bone on image 10 series 3, with rim sclerosis and central sclerosis but a band of lucency along the margins, probably a small osteoma or similar benign lesion as this is not really characteristic of prostate metastatic disease this patient with history of prostatectomy. This is  not felt to be related to the patient's acute symptoms. Sinuses/Orbits: Chronic left and acute on chronic right maxillary sinusitis with chronic bilateral ethmoid and sphenoid sinusitis. Other: No supplemental non-categorized findings. IMPRESSION: 1. Chronic paranasal sinusitis potentially with superimposed acute right maxillary sinusitis. 2. No acute intracranial findings. 3. 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 Electronically Signed   By: Van Clines M.D.   On: 07/05/2021 11:36   MR BRAIN WO CONTRAST  Result Date: 07/05/2021 CLINICAL DATA:  Neuro deficit, acute, stroke suspected. Right arm and leg numbness. EXAM: MRI HEAD WITHOUT CONTRAST TECHNIQUE: Multiplanar, multiecho pulse sequences of the brain and surrounding structures were obtained without intravenous contrast. COMPARISON:  Head CT 07/05/2021 FINDINGS: Brain: There is a 9 mm acute infarct in the lateral aspect of the left thalamus. Small T2 hyperintensities in the cerebral white matter bilaterally are nonspecific but compatible with mild chronic small vessel ischemic disease. There is mild cerebral atrophy. No intracranial hemorrhage, mass, midline shift, or extra-axial fluid collection  is identified. Vascular: Major intracranial vascular flow voids are preserved. Skull and upper cervical spine: Mild nonspecific diffuse bone marrow heterogeneity. No destructive skull lesion. Sinuses/Orbits: Unremarkable orbits. Moderate mucosal thickening in the paranasal sinuses with fluid in the maxillary sinuses. Clear mastoid air cells. Other: None. IMPRESSION: 1. Acute left thalamic infarct. 2. Mild chronic small vessel ischemic disease. 3.  Cerebral atrophy (ICD10-G31.9). Electronically Signed   By: Logan Bores M.D.   On: 07/05/2021 14:29    EKG: Independently reviewed.  Still in ordered  status.  Assessment/Plan Principal Problem:   Cerebrovascular accident (CVA) of left thalamus ([place]) Admit to telemetry/inpatient. Frequent neurochecks. Swallow screen. Consult PT/OT. Consult SLP. Check hemoglobin A1c.   Check fasting lipids. Check carotid Doppler. Allow permissive hypertension. Consult neuro hospitalist team on arrival to [facility].  Active Problems:   Alcohol abuse Begin CIWA protocol. Folate, MVI and thiamine supplementation.    Hypocalcemia Check magnesium level. Recheck calcium level in AM.    Essential hypertension Allow permissive hypertension up to 220/110 mmHg. Will need to start antihypertensive therapy before the discharge.    Class 2 obesity Lifestyle modifications. Follow-up as an outpatient.   DVT prophylaxis: SCDs. Code Status:   Full code. Family Communication:   Disposition Plan:   Patient is from:  Home.   Anticipated DC to:  Home.   Anticipated DC date:  07/07/21.  Anticipated DC barriers: Clinical status.  Consults called:  Neuro hospitalist team. Admission status:  Inpatient/telemetry.  Severity of Illness:High severity in the left thalamic CVA.   Reubin Milan MD [facility]  How to contact the [facility] Attending or Consulting provider [place] or covering provider during after hours [place], for this patient?   Check the care team in [facility] and look for a) attending/consulting [facility] provider listed and b) the [facility] team listed Log into [email or web address] and use [place]'s universal password to access. If you do not have the password, please contact the hospital operator. Locate the [facility] provider you are looking for under [facility] and page to a number that you can be directly reached. If you still have difficulty reaching the provider, please page the [facility] (Director on Call) for the Hospitalists listed on amion for assistance.  07/05/2021, 3:59 PM   This document was preparation Dragon voice recognition  software and may contain some unintended transcription errors.

## 2021-07-05 NOTE — ED Notes (Signed)
Pt ambulatory without assistance.  

## 2021-07-05 NOTE — Plan of Care (Signed)
°  Problem: Education: Goal: Knowledge of disease or condition will improve 07/05/2021 2321 by Quintin Alto, RN Outcome: Progressing 07/05/2021 2219 by Quintin Alto, RN Outcome: Progressing   Problem: Education: Goal: Knowledge of secondary prevention will improve (SELECT ALL) 07/05/2021 2321 by Quintin Alto, RN Outcome: Progressing 07/05/2021 2219 by Quintin Alto, RN Outcome: Progressing   Problem: Education: Goal: Knowledge of patient specific risk factors will improve (INDIVIDUALIZE FOR PATIENT) 07/05/2021 2321 by Quintin Alto, RN Outcome: Progressing 07/05/2021 2219 by Quintin Alto, RN Outcome: Progressing   Problem: Coping: Goal: Will verbalize positive feelings about self 07/05/2021 2321 by Quintin Alto, RN Outcome: Progressing 07/05/2021 2219 by Quintin Alto, RN Outcome: Progressing   Problem: Education: Goal: Knowledge of General Education information will improve Description: Including pain rating scale, medication(s)/side effects and non-pharmacologic comfort measures Outcome: Progressing

## 2021-07-05 NOTE — ED Notes (Signed)
[facility] has arrived to transport the patient to [place]

## 2021-07-05 NOTE — ED Notes (Signed)
Patient to CT.

## 2021-07-05 NOTE — ED Triage Notes (Signed)
Patient reports that he woke this AM with right arm and right leg numbness. Patient states when he drove to work this AM he could not feel the gas pedal when driving. No slurred speech.

## 2021-07-06 ENCOUNTER — Inpatient Hospital Stay (HOSPITAL_COMMUNITY): Payer: BC Managed Care – PPO

## 2021-07-06 DIAGNOSIS — I6381 Other cerebral infarction due to occlusion or stenosis of small artery: Secondary | ICD-10-CM

## 2021-07-06 DIAGNOSIS — F1010 Alcohol abuse, uncomplicated: Secondary | ICD-10-CM

## 2021-07-06 DIAGNOSIS — E7800 Pure hypercholesterolemia, unspecified: Secondary | ICD-10-CM

## 2021-07-06 DIAGNOSIS — I10 Essential (primary) hypertension: Secondary | ICD-10-CM

## 2021-07-06 LAB — HEMOGLOBIN A1C
Hgb A1c MFr Bld: 5.3 % (ref 4.8–5.6)
Mean Plasma Glucose: 105.41 mg/dL

## 2021-07-06 LAB — ECHOCARDIOGRAM COMPLETE
AR max vel: 4.52 cm2
AV Peak grad: 9.4 mmHg
Ao pk vel: 1.53 m/s
Area-P 1/2: 3.51 cm2
Height: 68 in
S' Lateral: 2.9 cm
Weight: 4080 oz

## 2021-07-06 LAB — LIPID PANEL
Cholesterol: 144 mg/dL (ref 0–200)
HDL: 45 mg/dL (ref >40)
LDL Cholesterol: 81 mg/dL (ref 0–99)
Total CHOL/HDL Ratio: 3.2 RATIO
Triglycerides: 90 mg/dL (ref <150)
VLDL: 18 mg/dL (ref 0–40)

## 2021-07-06 LAB — HIV ANTIBODY (ROUTINE TESTING W REFLEX): HIV Screen 4th Generation wRfx: NONREACTIVE

## 2021-07-06 MED ORDER — THIAMINE HCL 100 MG PO TABS: 100.0000 mg | ORAL_TABLET | Freq: Every day | ORAL | Status: AC

## 2021-07-06 MED ORDER — ATORVASTATIN CALCIUM 40 MG PO TABS: 40.0000 mg | ORAL_TABLET | Freq: Every day | ORAL | 0 refills | Status: AC

## 2021-07-06 MED ORDER — ATORVASTATIN CALCIUM 40 MG PO TABS
40.0000 mg | ORAL_TABLET | Freq: Every day | ORAL | Status: DC
  Administered 2021-07-06: 40 mg via ORAL
  Filled 2021-07-06: qty 1

## 2021-07-06 MED ORDER — PANTOPRAZOLE SODIUM 40 MG PO TBEC: 40.0000 mg | DELAYED_RELEASE_TABLET | Freq: Every day | ORAL | 0 refills | Status: DC

## 2021-07-06 MED ORDER — CLOPIDOGREL BISULFATE 75 MG PO TABS: 75.0000 mg | ORAL_TABLET | Freq: Every day | ORAL | 0 refills | Status: DC

## 2021-07-06 MED ORDER — ADULT MULTIVITAMIN W/MINERALS CH: 1.0000 | ORAL_TABLET | Freq: Every day | ORAL | Status: AC

## 2021-07-06 MED ORDER — ASPIRIN 325 MG PO TBEC: 325.0000 mg | DELAYED_RELEASE_TABLET | Freq: Every day | ORAL | 0 refills | Status: AC

## 2021-07-06 MED ORDER — AMLODIPINE BESYLATE 2.5 MG PO TABS: 2.5000 mg | ORAL_TABLET | Freq: Every day | ORAL | 0 refills | Status: DC

## 2021-07-06 MED ORDER — ASPIRIN EC 325 MG PO TBEC: 325.0000 mg | DELAYED_RELEASE_TABLET | Freq: Every day | ORAL | Status: DC

## 2021-07-06 MED ORDER — FOLIC ACID 1 MG PO TABS: 1.0000 mg | ORAL_TABLET | Freq: Every day | ORAL | Status: AC

## 2021-07-06 NOTE — Progress Notes (Addendum)
STROKE TEAM PROGRESS NOTE   INTERVAL HISTORY No family at the bedside. Awaiting echocardiogram. CIWA protocol ordered, no ativan needed at this time. Patient remains hypertensive. Consider slowly resuming home antihypertensive medications now that he does have a diet. Per patient, sensation is improving on his right side. He states that the numbness and tingling sensation is gone in his right leg and improving in his right arm. 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 MRI showed a left thalamic infarct.   Vitals:   07/05/21 2100 07/05/21 2150 07/06/21 0057 07/06/21 0434  BP: (!) 175/105 (!) 185/99 (!) 161/95 (!) 158/85  Pulse:  84 84 79  Resp: (!) 22 20 19 15   Temp:  99.2 F (37.3 C) 97.7 F (36.5 C) 98.4 F (36.9 C)  TempSrc:  Oral Oral Oral  SpO2:  100% 100% 99%  Weight:      Height:       CBC:  Recent Labs  Lab 07/05/21 1036 07/05/21 1142  WBC 5.7  --   NEUTROABS 3.5  --   HGB 13.5 13.6  HCT 40.4 40.0  MCV 88.8  --   PLT 286  --    Basic Metabolic Panel:  Recent Labs  Lab 07/05/21 1036 07/05/21 1142  NA 137 139  K 3.7 4.3  CL 102 102  CO2 27  --   GLUCOSE 108* 106*  BUN 10 10  CREATININE 0.67 0.80  CALCIUM 8.4*  --   MG 2.0  --   PHOS 2.9  --    Lipid Panel:  Recent Labs  Lab 07/06/21 0250  CHOL 144  TRIG 90  HDL 45  CHOLHDL 3.2  VLDL 18  LDLCALC 81   HgbA1c:  Recent Labs  Lab 07/06/21 0250  HGBA1C 5.3   Urine Drug Screen:  Recent Labs  Lab 07/05/21 1514  LABOPIA NONE DETECTED  COCAINSCRNUR NONE DETECTED  LABBENZ NONE DETECTED  AMPHETMU NONE DETECTED  THCU NONE DETECTED  LABBARB NONE DETECTED    Alcohol Level  Recent Labs  Lab 07/05/21 1036  ETH <10    IMAGING past 24 hours CT HEAD WO CONTRAST  Result Date: 07/05/2021 CLINICAL DATA:  Right arm and leg numbness upon awakening this morning. EXAM: CT HEAD WITHOUT CONTRAST TECHNIQUE: Contiguous axial images were obtained from the base of the skull through the vertex without  intravenous contrast. RADIATION DOSE REDUCTION: This exam was performed according to the departmental dose-optimization program which includes automated exposure control, adjustment of the mA and/or kV according to patient size and/or use of iterative reconstruction technique. COMPARISON:  None. FINDINGS: Brain: The brainstem, cerebellum, cerebral peduncles, thalami, basal ganglia, basilar cisterns, and ventricular system appear within normal limits. No intracranial hemorrhage, mass lesion, or acute CVA. Vascular: Unremarkable Skull: 0.8 by 0.6 cm primarily sclerotic lesion in the left frontal bone on image 10 series 3, with rim sclerosis and central sclerosis but a band of lucency along the margins, probably a small osteoma or similar benign lesion as this is not really characteristic of prostate metastatic disease this patient with history of prostatectomy. This is not felt to be related to the patient's acute symptoms. Sinuses/Orbits: Chronic left and acute on chronic right maxillary sinusitis with chronic bilateral ethmoid and sphenoid sinusitis. Other: No supplemental non-categorized findings. IMPRESSION: 1. Chronic paranasal sinusitis potentially with superimposed acute right maxillary sinusitis. 2. No acute intracranial findings. 3. 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 Electronically Signed   By: Van Clines M.D.   On: 07/05/2021 11:36   MR ANGIO HEAD WO CONTRAST  Result Date: 07/05/2021 CLINICAL DATA:  Neuro deficit, acute, stroke suspected. Acute left thalamic infarction by previous MRI. EXAM: MRA HEAD WITHOUT CONTRAST TECHNIQUE: Angiographic images of the Circle of Willis were acquired using MRA technique without intravenous contrast. COMPARISON:  MRI earlier same day FINDINGS: Anterior circulation: Both internal carotid arteries  are patent through the skull base and siphon regions. The anterior and middle cerebral vessels are patent. No large vessel occlusion or correctable proximal stenosis. No aneurysm or vascular malformation. Fetal origin right PCA. Posterior circulation: Both vertebral arteries are patent through the foramen magnum and supply their respective PICA. Beyond that the right vertebral close on to supply the basilar artery. Left vertebral artery is severely disease or occluded distal to PICA. The basilar artery is patent. Superior cerebellar and posterior cerebral arteries are patent, right PCA showing fetal origin as discussed above. Left PCA shows diminished filling of the distal branches. Anatomic variants: None other significant. Other: None. IMPRESSION: No anterior circulation pathology seen. Left vertebral artery supplies PICA but is abnormal distal to that, either severely disease or occluded. Right vertebral artery supplies the basilar. No significant basilar stenosis. Left PCA is abnormal with diminished filling of the more distal branch vessels. Right PCA is a large vessel it takes fetal origin from the anterior circulation. Electronically Signed   By: Nelson Chimes M.D.   On: 07/05/2021 18:20   MR BRAIN WO CONTRAST  Result Date: 07/05/2021 CLINICAL DATA:  Neuro deficit, acute, stroke suspected. Right arm and leg numbness. EXAM: MRI HEAD WITHOUT CONTRAST TECHNIQUE: Multiplanar, multiecho pulse sequences of the brain and surrounding structures were obtained without intravenous contrast. COMPARISON:  Head CT 07/05/2021 FINDINGS: Brain: There is a 9 mm acute infarct in the lateral aspect of the left thalamus. Small T2 hyperintensities in the cerebral white matter bilaterally are nonspecific but compatible with mild chronic small vessel ischemic disease. There is mild cerebral atrophy. No intracranial hemorrhage, mass, midline shift, or extra-axial fluid collection is identified. Vascular: Major intracranial  vascular flow voids are preserved. Skull and upper cervical spine: Mild nonspecific diffuse bone marrow heterogeneity. No destructive skull lesion. Sinuses/Orbits: Unremarkable orbits. Moderate mucosal thickening in the paranasal sinuses with fluid in the maxillary sinuses. Clear mastoid air cells. Other: None. IMPRESSION: 1. Acute left thalamic infarct. 2. Mild chronic small vessel ischemic disease. 3.  Cerebral atrophy (ICD10-G31.9). Electronically Signed   By: Logan Bores M.D.   On: 07/05/2021 14:29   VAS US CAROTID (at [facility] and [facility] only)  Result Date: 07/05/2021 Carotid Arterial Duplex Study Patient Name:  AD GUTTMAN  Date of Exam:   07/05/2021 Medical Rec #: [id number]        Accession #:    [id number] Date of Birth: 1961-01-19        Patient Gender: M Patient Age:   61 years Exam Location:  [facility] Procedure:      VAS US CAROTID Referring Phys: Shanon Brow ORTIZ --------------------------------------------------------------------------------  Indications:       CVA. Risk Factors:      Hypertension, no history of smoking. Comparison Study:  No previous exams Performing Technologist: Jody Hill RVT, RDMS  Examination Guidelines: A complete evaluation includes B-mode imaging, spectral Doppler, color Doppler, and power Doppler as needed of all accessible portions of each vessel. Bilateral testing  is considered an integral part of a complete examination. Limited examinations for reoccurring indications may be performed as noted.  Right Carotid Findings: +----------+--------+--------+--------+------------------+------------------+             PSV cm/s EDV cm/s Stenosis Plaque Description Comments            +----------+--------+--------+--------+------------------+------------------+  CCA Prox   55       13                                   intimal thickening  +----------+--------+--------+--------+------------------+------------------+  CCA Distal 50       10                                   intimal  thickening  +----------+--------+--------+--------+------------------+------------------+  ICA Prox   52       17                                                       +----------+--------+--------+--------+------------------+------------------+  ICA Distal 91       18                                                       +----------+--------+--------+--------+------------------+------------------+  ECA        38       0                                                        +----------+--------+--------+--------+------------------+------------------+ +----------+--------+-------+----------------+-------------------+             PSV cm/s EDV cms Describe         Arm Pressure (mmHG)  +----------+--------+-------+----------------+-------------------+  Subclavian 102              Multiphasic, WNL                      +----------+--------+-------+----------------+-------------------+ +---------+--------+--+--------+--+---------+  Vertebral PSV cm/s 57 EDV cm/s 11 Antegrade  +---------+--------+--+--------+--+---------+  Left Carotid Findings: +----------+--------+--------+--------+------------------+------------------+             PSV cm/s EDV cm/s Stenosis Plaque Description Comments            +----------+--------+--------+--------+------------------+------------------+  CCA Prox   77       13                                   intimal thickening  +----------+--------+--------+--------+------------------+------------------+  CCA Distal 53       11                                                       +----------+--------+--------+--------+------------------+------------------+  ICA Prox   82       25                                                       +----------+--------+--------+--------+------------------+------------------+  ICA Distal 85       22                                                       +----------+--------+--------+--------+------------------+------------------+  ECA        42       0                                                         +----------+--------+--------+--------+------------------+------------------+ +----------+--------+--------+----------------+-------------------+             PSV cm/s EDV cm/s Describe         Arm Pressure (mmHG)  +----------+--------+--------+----------------+-------------------+  Subclavian 87                Multiphasic, WNL                      +----------+--------+--------+----------------+-------------------+ +---------+--------+--+--------+-+---------+  Vertebral PSV cm/s 38 EDV cm/s 8 Antegrade  +---------+--------+--+--------+-+---------+   Summary: Right Carotid: The extracranial vessels were near-normal with only minimal wall                thickening or plaque. Left Carotid: The extracranial vessels were near-normal with only minimal wall               thickening or plaque. Vertebrals:  Bilateral vertebral arteries demonstrate antegrade flow. Subclavians: Normal flow hemodynamics were seen in bilateral subclavian              arteries. *See table(s) above for measurements and observations.     Preliminary     PHYSICAL EXAM  Physical Exam  Constitutional: Appears well-developed and well-nourished.  Psych: Affect appropriate to situation Cardiovascular: Normal rate and regular rhythm.  Respiratory: Effort normal, non-labored breathing  Neuro: Mental Status: Patient is awake, alert, oriented to person, place, month, year, and situation. Patient is able to give a clear and coherent history. No signs of aphasia or neglect Cranial Nerves: II: Visual Fields are full. Pupils are equal, round, and reactive to light.   III,IV, VI: EOMI without ptosis or diploplia.  V: Facial sensation is symmetric to temperature VII: Facial movement is symmetric resting and smiling VIII: Hearing is intact to voice X: Palate elevates symmetrically XI: Shoulder shrug is symmetric. XII: Tongue protrudes midline without atrophy or fasciculations.  Motor: Tone is normal.  Bulk is normal. 5/5 strength was present in all four extremities.  Sensory: Sensation is diminished to light touch and temperature in the right arm. Tingling sensation improving in right arm. Sensation now equal in bilateral lower extremities. No extinction to DSS present. Sensation in face is symmetrical  Cerebellar: FNF and HKS are intact bilaterally   ASSESSMENT/PLAN [name] is a 61 y.o. male with history of  ETOH abuse (6 drinks/day reported), BPH, HTN, OA,  and prostate cancer s/p robotic prostatectomy presenting with numbness to right arm and leg. Patient went to bed 1/25 at 2230 hours in his normal state of health. He woke up 1/26 at 0400 and he did not notice any symptoms until he woke up again around 0600 hours. He got up and walked around the house and drove himself to work. He said he was clumsy with the gas pedal as not knowing how hard he was pushing. Initial NIHSS 1, he was out side of the window for TNKase and no LVO on MRA.    Stroke:  Left thalamic infarct likely secondary small vessel disease Code Stroke sclerotic lesion in the left frontal bone is probably an osteoma MRI  Acute left thalamic infarct, mild chronic small vessel disease MRA  Left vertebral artery supplies PICA but is abnormal distal to that, either severely disease or occluded. Right vertebral artery supplies the basilar. Left PCA is abnormal with diminished filling of the more distal branch vessels. Right PCA is a large vessel it takes fetal origin from the anterior circulation. Carotid Doppler  unremarkable 2D Echo EF 65-70%, no regional wall abnormalities. No atrial level shunt LDL 81 HgbA1c 5.3 VTE prophylaxis - SCDs No antithrombotic prior to admission, now on aspirin 81 mg daily and clopidogrel 75 mg daily for 3 weeks and then ASA alone.  Therapy recommendations:  Pending Disposition:  Pending  Hypertension Home meds:  None Stable on the high end Recommend starting BP meds on  discharge Long-term BP goal normotensive  Hyperlipidemia Home meds:  None LDL 81, goal < 70 Add Atorvastatin 40mg   Continue statin at discharge  Other Stroke Risk Factors ETOH use, alcohol level <10, advised to drink no more than 2 drink(s) a day On CIWA protocol Thiamine supplement TOC provided resources for patient Obesity, Body mass index is 38.77 kg/m., BMI >/= 30 associated with increased stroke risk, recommend weight loss, diet and exercise as appropriate   Other Active Problems Hx of prostate cancer with prostatectomy left distal femur enchondroma seen by ortho in 2019  Hospital day # 1  Patient seen and examined by NP/APP with MD. MD to update note as needed.   Janine Ores, DNP, FNP-BC [facility] Pager: [phone]   ATTENDING NOTE: I reviewed above note and agree with the assessment and plan. Pt was seen and examined.   61 year old male with history of alcohol abuse, hypertension, prostate cancer status post surgery admitted for right arm and leg numbness.  MRI showed left thalamic small infarct.  Carotid Doppler negative.  UDS negative.  MRA head distal left VA occlusion and distal left PCA stenosis/occlusion.  EF 65 to 70%, LDL 81, A1c 5.3.  Creatinine 0.80.  BP elevated.  On exam, patient still has mild right-sided paresthesia but otherwise neuro intact.  Etiology for patient stroke likely due to small vessel disease given risk factors.  Stroke risk factor modification education provided.  Given left PCA distal occlusion/high-grade stenosis, recommend aspirin 325 and Plavix 75 DAPT for 3 months and then aspirin alone.  Continue Lipitor 40.  On CIWA protocol and B1, for acid and multivitamin.  For detailed assessment and plan, please refer to above as I have made changes wherever appropriate.   Neurology will sign off. Please call with questions. Pt will follow up with stroke clinic NP at [facility] in about 4 weeks. Thanks for the consult.   Rosalin Hawking,  MD PhD Stroke Neurology 07/06/2021  1:56 PM   To contact Stroke Continuity provider, please refer to [email or web address] After hours, contact General Neurology

## 2021-07-06 NOTE — Discharge Summary (Signed)
Physician Discharge Summary  [place] EVO:[id number] DOB: 04-08-1961 DOA: 07/05/2021  PCP: Pcp, No  Admit date: 07/05/2021 Discharge date: 07/06/2021  Admitted From: Home Disposition:  Home   Recommendations for Outpatient Follow-up:  Follow up with PCP in 1-2 weeks Please follow up with neurology as an outpatient Adjust antihypertensive medications as needed.  Home Health:NO   Discharge Condition:Stable CODE STATUS: (FULL) Diet recommendation: Heart Healthy   Brief/Interim Summary:  Kyle Chang is a 61 y.o. male with medical history significant of alcohol abuse, prostate cancer, history of prostatectomy, class II obesity who is coming to the emergency department due to developing right-sided weakness since this morning which she noticed while he was driving as he was unable to pressed the gas and brake pedals correctly, or I significant for acute CVA, he was admitted for further work-up.  Discharge Diagnoses:  Principal Problem:   Cerebrovascular accident (CVA) of left thalamus ([place]) Active Problems:   Class 2 obesity   Alcohol abuse   Hypocalcemia   Essential hypertension  Acute CVA - Left thalamic infarct likely secondary due to occlusion or stenosis source - MRI  Acute left thalamic infarct, mild chronic small vessel disease - MRA  Left vertebral artery supplies PICA but is abnormal distal to that, either severely disease or occluded. Right vertebral artery supplies the basilar. Right PCA is a large vessel it takes fetal origin from the anterior circulation. - Carotid Doppler  Extracranial vessels near normal with minimal wall thickening, bilateral vertebral with antegrade flow, normal flow in subclavians - 2D Echo EF 65-70%, no regional wall abnormalities. No atrial level shunt - LDL 81 - HgbA1c 5.3 -I have discussed with neurology Dr. Erlinda Hong ,stroke likely due to left PCA stenosis vs. distal occlusion.  Recommendation for ASA 325 and plavix 75 DAPT for 3 months and  then ASA alone given the large vessel stenosis  Alcohol abuse On CIWA protocol during hospital stay, no evidence of withdrawals, he will be discharged on thiamine, folic acid and multivitamins  Hypertension - not on any home medications, allow for permissive hypertension, he will be started on low-dose Norvasc 2.5 mg p.o. in couple days    Hyperlipidemia -LDL is 81, started on atorvastatin 40 mg oral daily     Class 2 obesity Lifestyle modifications. Follow-up as an outpatient  Discharge Instructions  Discharge Instructions     Ambulatory referral to Neurology   Complete by: As directed    Follow up with stroke clinic NP (Jessica Vanschaick or Cecille Rubin, if both not available, consider Zachery Dauer, or Ahern) at [facility] in about 4 weeks. Thanks.   Ambulatory referral to Physical Therapy   Complete by: As directed    Diet - low sodium heart healthy   Complete by: As directed    Discharge instructions   Complete by: As directed    Follow with Primary MD in 7 days   Get CBC, CMP, checked  by Primary MD next visit.    Activity: As tolerated with Full fall precautions use walker/cane & assistance as needed   Disposition Home    Diet: Heart Healthy     On your next visit with your primary care physician please Get Medicines reviewed and adjusted.   Please request your Prim.MD to go over all Hospital Tests and Procedure/Radiological results at the follow up, please get all Hospital records sent to your Prim MD by signing hospital release before you go home.   If you experience worsening of your admission symptoms, develop  shortness of breath, life threatening emergency, suicidal or homicidal thoughts you must seek medical attention immediately by calling 911 or calling your MD immediately  if symptoms less severe.  You Must read complete instructions/literature along with all the possible adverse reactions/side effects for all the Medicines you take and that have been  prescribed to you. Take any new Medicines after you have completely understood and accpet all the possible adverse reactions/side effects.   Do not drive, operating heavy machinery, perform activities at heights, swimming or participation in water activities or provide baby sitting services if your were admitted for syncope or siezures until you have seen by Primary MD or a Neurologist and advised to do so again.  Do not drive when taking Pain medications.    Do not take more than prescribed Pain, Sleep and Anxiety Medications  Special Instructions: If you have smoked or chewed Tobacco  in the last 2 yrs please stop smoking, stop any regular Alcohol  and or any Recreational drug use.  Wear Seat belts while driving.   Please note  You were cared for by a hospitalist during your hospital stay. If you have any questions about your discharge medications or the care you received while you were in the hospital after you are discharged, you can call the unit and asked to speak with the hospitalist on call if the hospitalist that took care of you is not available. Once you are discharged, your primary care physician will handle any further medical issues. Please note that NO REFILLS for any discharge medications will be authorized once you are discharged, as it is imperative that you return to your primary care physician (or establish a relationship with a primary care physician if you do not have one) for your aftercare needs so that they can reassess your need for medications and monitor your lab values.   Increase activity slowly   Complete by: As directed       Allergies as of 07/06/2021   No Known Allergies      Medication List     STOP taking these medications    cephALEXin 500 MG capsule Commonly known as: KEFLEX   Tumersaid Tabs       TAKE these medications    aspirin 325 MG EC tablet Take 1 tablet (325 mg total) by mouth daily. Start taking on: July 07, 2021    atorvastatin 40 MG tablet Commonly known as: LIPITOR Take 1 tablet (40 mg total) by mouth daily. Start taking on: July 07, 2021   clopidogrel 75 MG tablet Commonly known as: PLAVIX Take 1 tablet (75 mg total) by mouth daily. Start taking on: July 07, 9796   folic acid 1 MG tablet Commonly known as: FOLVITE Take 1 tablet (1 mg total) by mouth daily. Start taking on: July 07, 2021   multivitamin with minerals Tabs tablet Take 1 tablet by mouth daily. Start taking on: July 07, 2021   pantoprazole 40 MG tablet Commonly known as: Protonix Take 1 tablet (40 mg total) by mouth daily.   thiamine 100 MG tablet Take 1 tablet (100 mg total) by mouth daily.        Follow-up Information     [facility]. Schedule an appointment as soon as possible for a visit in 1 month(s).   Specialty: Neurology Why: stroke clinic Contact information: [address] [place]. Schedule an appointment  as soon as possible for a visit in 1 month(s).   Specialty: Internal Medicine Why: PCP if unable to get an appointment with Dr Dominic Pea information: [address] [place]. Schedule an appointment as soon as possible for a visit.   Specialty: Rehabilitation Why: Please call the neurorehabilitation center to schedule outpatient PT if needed, pending improvement after discharge. Contact information: [address] [place] [id number] [place] [address] [phone]               No Known Allergies  Consultations: NEUROLOGY   Procedures/Studies: CT HEAD WO CONTRAST  Result Date: 07/05/2021 CLINICAL DATA:  Right arm and leg numbness upon awakening this morning. EXAM: CT HEAD WITHOUT CONTRAST TECHNIQUE: Contiguous axial images were  obtained from the base of the skull through the vertex without intravenous contrast. RADIATION DOSE REDUCTION: This exam was performed according to the departmental dose-optimization program which includes automated exposure control, adjustment of the mA and/or kV according to patient size and/or use of iterative reconstruction technique. COMPARISON:  None. FINDINGS: Brain: The brainstem, cerebellum, cerebral peduncles, thalami, basal ganglia, basilar cisterns, and ventricular system appear within normal limits. No intracranial hemorrhage, mass lesion, or acute CVA. Vascular: Unremarkable Skull: 0.8 by 0.6 cm primarily sclerotic lesion in the left frontal bone on image 10 series 3, with rim sclerosis and central sclerosis but a band of lucency along the margins, probably a small osteoma or similar benign lesion as this is not really characteristic of prostate metastatic disease this patient with history of prostatectomy. This is not felt to be related to the patient's acute symptoms. Sinuses/Orbits: Chronic left and acute on chronic right maxillary sinusitis with chronic bilateral ethmoid and sphenoid sinusitis. Other: No supplemental non-categorized findings. IMPRESSION: 1. Chronic paranasal sinusitis potentially with superimposed acute right maxillary sinusitis. 2. No acute intracranial findings. 3. 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 Electronically Signed   By: Van Clines M.D.   On: 07/05/2021 11:36   MR ANGIO HEAD WO CONTRAST  Result Date: 07/05/2021 CLINICAL DATA:  Neuro deficit, acute, stroke suspected. Acute left thalamic infarction by previous MRI. EXAM: MRA HEAD WITHOUT CONTRAST TECHNIQUE: Angiographic images of the Circle of Willis were acquired using MRA technique without intravenous contrast. COMPARISON:  MRI earlier same day  FINDINGS: Anterior circulation: Both internal carotid arteries are patent through the skull base and siphon regions. The anterior and middle cerebral vessels are patent. No large vessel occlusion or correctable proximal stenosis. No aneurysm or vascular malformation. Fetal origin right PCA. Posterior circulation: Both vertebral arteries are patent through the foramen magnum and supply their respective PICA. Beyond that the right vertebral close on to supply the basilar artery. Left vertebral artery is severely disease or occluded distal to PICA. The basilar artery is patent. Superior cerebellar and posterior cerebral arteries are patent, right PCA showing fetal origin as discussed above. Left PCA shows diminished filling of the distal branches. Anatomic variants: None other significant. Other: None. IMPRESSION: No anterior circulation pathology seen. Left vertebral artery supplies PICA but is abnormal distal to that, either severely disease or occluded. Right vertebral artery supplies the basilar. No significant basilar stenosis. Left PCA is abnormal with diminished filling of the more distal branch vessels. Right PCA is a large vessel  it takes fetal origin from the anterior circulation. Electronically Signed   By: Nelson Chimes M.D.   On: 07/05/2021 18:20   MR BRAIN WO CONTRAST  Result Date: 07/05/2021 CLINICAL DATA:  Neuro deficit, acute, stroke suspected. Right arm and leg numbness. EXAM: MRI HEAD WITHOUT CONTRAST TECHNIQUE: Multiplanar, multiecho pulse sequences of the brain and surrounding structures were obtained without intravenous contrast. COMPARISON:  Head CT 07/05/2021 FINDINGS: Brain: There is a 9 mm acute infarct in the lateral aspect of the left thalamus. Small T2 hyperintensities in the cerebral white matter bilaterally are nonspecific but compatible with mild chronic small vessel ischemic disease. There is mild cerebral atrophy. No intracranial hemorrhage, mass, midline shift, or extra-axial fluid  collection is identified. Vascular: Major intracranial vascular flow voids are preserved. Skull and upper cervical spine: Mild nonspecific diffuse bone marrow heterogeneity. No destructive skull lesion. Sinuses/Orbits: Unremarkable orbits. Moderate mucosal thickening in the paranasal sinuses with fluid in the maxillary sinuses. Clear mastoid air cells. Other: None. IMPRESSION: 1. Acute left thalamic infarct. 2. Mild chronic small vessel ischemic disease. 3.  Cerebral atrophy (ICD10-G31.9). Electronically Signed   By: Logan Bores M.D.   On: 07/05/2021 14:29   ECHOCARDIOGRAM COMPLETE  Result Date: 07/06/2021    ECHOCARDIOGRAM REPORT   Patient Name:   Kyle Chang Date of Exam: 07/06/2021 Medical Rec #:  [id number]       Height:       68.0 in Accession #:    [id number]      Weight:       255.0 lb Date of Birth:  11/22/1960       BSA:          2.266 m Patient Age:    15 years        BP:           153/84 mmHg Patient Gender: M               HR:           90 bpm. Exam Location:  Inpatient Procedure: 2D Echo Indications:    Stroke  History:        Patient has no prior history of Echocardiogram examinations.                 Risk Factors:Hypertension.  Sonographer:    Jefferey Pica Referring Phys: [id number] [place]  1. Left ventricular ejection fraction, by estimation, is 65 to 70%. The left ventricle has normal function. The left ventricle has no regional wall motion abnormalities. There is mild concentric left ventricular hypertrophy. Left ventricular diastolic parameters were normal.  2. Right ventricular systolic function is normal. The right ventricular size is normal. There is normal pulmonary artery systolic pressure. The estimated right ventricular systolic pressure is 53.2 mmHg.  3. The mitral valve is normal in structure. No evidence of mitral valve regurgitation. No evidence of mitral stenosis.  4. The aortic valve is normal in structure. Aortic valve regurgitation is not visualized.  No aortic stenosis is present.  5. Aortic dilatation noted. There is mild dilatation of the aortic root, measuring 39 mm. There is mild dilatation of the ascending aorta, measuring 39 mm.  6. The inferior vena cava is normal in size with greater than 50% respiratory variability, suggesting right atrial pressure of 3 mmHg. FINDINGS  Left Ventricle: Left ventricular ejection fraction, by estimation, is 65 to 70%. The left ventricle has normal function. The left ventricle has no regional wall motion abnormalities. The  left ventricular internal cavity size was normal in size. There is  mild concentric left ventricular hypertrophy. Left ventricular diastolic parameters were normal. Normal left ventricular filling pressure. Right Ventricle: The right ventricular size is normal. No increase in right ventricular wall thickness. Right ventricular systolic function is normal. There is normal pulmonary artery systolic pressure. The tricuspid regurgitant velocity is 2.77 m/s, and  with an assumed right atrial pressure of 3 mmHg, the estimated right ventricular systolic pressure is 84.1 mmHg. Left Atrium: Left atrial size was normal in size. Right Atrium: Right atrial size was normal in size. Pericardium: There is no evidence of pericardial effusion. Mitral Valve: The mitral valve is normal in structure. No evidence of mitral valve regurgitation. No evidence of mitral valve stenosis. Tricuspid Valve: The tricuspid valve is normal in structure. Tricuspid valve regurgitation is trivial. No evidence of tricuspid stenosis. Aortic Valve: The aortic valve is normal in structure. Aortic valve regurgitation is not visualized. No aortic stenosis is present. Aortic valve peak gradient measures 9.4 mmHg. Pulmonic Valve: The pulmonic valve was normal in structure. Pulmonic valve regurgitation is trivial. No evidence of pulmonic stenosis. Aorta: Aortic dilatation noted. There is mild dilatation of the aortic root, measuring 39 mm. There is  mild dilatation of the ascending aorta, measuring 39 mm. Venous: The inferior vena cava is normal in size with greater than 50% respiratory variability, suggesting right atrial pressure of 3 mmHg. IAS/Shunts: No atrial level shunt detected by color flow Doppler.  LEFT VENTRICLE PLAX 2D LVIDd:         4.80 cm   Diastology LVIDs:         2.90 cm   LV e' medial:    10.10 cm/s LV PW:         1.30 cm   LV E/e' medial:  9.1 LV IVS:        1.30 cm   LV e' lateral:   7.72 cm/s LVOT diam:     2.40 cm   LV E/e' lateral: 11.9 LV SV:         114 LV SV Index:   50 LVOT Area:     4.52 cm  RIGHT VENTRICLE             IVC RV S prime:     20.80 cm/s  IVC diam: 1.70 cm TAPSE (M-mode): 3.1 cm LEFT ATRIUM             Index        RIGHT ATRIUM           Index LA diam:        3.60 cm 1.59 cm/m   RA Area:     16.90 cm LA Vol (A2C):   67.5 ml 29.79 ml/m  RA Volume:   46.30 ml  20.43 ml/m LA Vol (A4C):   75.5 ml 33.32 ml/m LA Biplane Vol: 72.3 ml 31.91 ml/m  AORTIC VALVE                 PULMONIC VALVE AV Area (Vmax): 4.52 cm     PV Vmax:       1.19 m/s AV Vmax:        153.00 cm/s  PV Peak grad:  5.7 mmHg AV Peak Grad:   9.4 mmHg LVOT Vmax:      153.00 cm/s LVOT Vmean:     86.700 cm/s LVOT VTI:       0.251 m  AORTA Ao Root diam: 3.90 cm Ao Asc  diam:  3.90 cm MITRAL VALVE               TRICUSPID VALVE MV Area (PHT): 3.51 cm    TR Peak grad:   30.7 mmHg MV Decel Time: 216 msec    TR Vmax:        277.00 cm/s MV E velocity: 91.70 cm/s MV A velocity: 75.00 cm/s  SHUNTS MV E/A ratio:  1.22        Systemic VTI:  0.25 m                            Systemic Diam: 2.40 cm Fransico Him MD Electronically signed by Fransico Him MD Signature Date/Time: 07/06/2021/10:35:22 AM    Final    VAS US CAROTID (at [facility] and [facility] only)  Result Date: 07/05/2021 Carotid Arterial Duplex Study Patient Name:  AKASHDEEP CHUBA  Date of Exam:   07/05/2021 Medical Rec #: [id number]        Accession #:    [id number] Date of Birth: 03/15/1961        Patient Gender: M  Patient Age:   83 years Exam Location:  [facility] Procedure:      VAS US CAROTID Referring Phys: Shanon Brow ORTIZ --------------------------------------------------------------------------------  Indications:       CVA. Risk Factors:      Hypertension, no history of smoking. Comparison Study:  No previous exams Performing Technologist: Jody Hill RVT, RDMS  Examination Guidelines: A complete evaluation includes B-mode imaging, spectral Doppler, color Doppler, and power Doppler as needed of all accessible portions of each vessel. Bilateral testing is considered an integral part of a complete examination. Limited examinations for reoccurring indications may be performed as noted.  Right Carotid Findings: +----------+--------+--------+--------+------------------+------------------+             PSV cm/s EDV cm/s Stenosis Plaque Description Comments            +----------+--------+--------+--------+------------------+------------------+  CCA Prox   55       13                                   intimal thickening  +----------+--------+--------+--------+------------------+------------------+  CCA Distal 50       10                                   intimal thickening  +----------+--------+--------+--------+------------------+------------------+  ICA Prox   52       17                                                       +----------+--------+--------+--------+------------------+------------------+  ICA Distal 91       18                                                       +----------+--------+--------+--------+------------------+------------------+  ECA        38       0                                                        +----------+--------+--------+--------+------------------+------------------+ +----------+--------+-------+----------------+-------------------+  PSV cm/s EDV cms Describe         Arm Pressure (mmHG)  +----------+--------+-------+----------------+-------------------+  Subclavian 102               Multiphasic, WNL                      +----------+--------+-------+----------------+-------------------+ +---------+--------+--+--------+--+---------+  Vertebral PSV cm/s 57 EDV cm/s 11 Antegrade  +---------+--------+--+--------+--+---------+  Left Carotid Findings: +----------+--------+--------+--------+------------------+------------------+             PSV cm/s EDV cm/s Stenosis Plaque Description Comments            +----------+--------+--------+--------+------------------+------------------+  CCA Prox   77       13                                   intimal thickening  +----------+--------+--------+--------+------------------+------------------+  CCA Distal 53       11                                                       +----------+--------+--------+--------+------------------+------------------+  ICA Prox   82       25                                                       +----------+--------+--------+--------+------------------+------------------+  ICA Distal 85       22                                                       +----------+--------+--------+--------+------------------+------------------+  ECA        42       0                                                        +----------+--------+--------+--------+------------------+------------------+ +----------+--------+--------+----------------+-------------------+             PSV cm/s EDV cm/s Describe         Arm Pressure (mmHG)  +----------+--------+--------+----------------+-------------------+  Subclavian 87                Multiphasic, WNL                      +----------+--------+--------+----------------+-------------------+ +---------+--------+--+--------+-+---------+  Vertebral PSV cm/s 38 EDV cm/s 8 Antegrade  +---------+--------+--+--------+-+---------+   Summary: Right Carotid: The extracranial vessels were near-normal with only minimal wall                thickening or plaque. Left Carotid: The extracranial vessels were  near-normal with only minimal wall               thickening or plaque. Vertebrals:  Bilateral vertebral arteries demonstrate antegrade flow. Subclavians: Normal flow hemodynamics were seen in bilateral subclavian  arteries. *See table(s) above for measurements and observations.     Preliminary       Subjective:  Reports he is feeling much better now, weakness in the right side significantly improved, Discharge Exam: Vitals:   07/06/21 1222 07/06/21 1614  BP: (!) 159/86 (!) 153/94  Pulse: 91 98  Resp: 18 18  Temp: 98.2 F (36.8 C) 98.4 F (36.9 C)  SpO2: 100% 100%   Vitals:   07/06/21 0434 07/06/21 0827 07/06/21 1222 07/06/21 1614  BP: (!) 158/85 (!) 153/84 (!) 159/86 (!) 153/94  Pulse: 79 92 91 98  Resp: 15 17 18 18   Temp: 98.4 F (36.9 C) 99.1 F (37.3 C) 98.2 F (36.8 C) 98.4 F (36.9 C)  TempSrc: Oral Oral Oral Oral  SpO2: 99% 100% 100% 100%  Weight:      Height:        General: Pt is alert, awake, not in acute distress Cardiovascular: RRR, S1/S2 +, no rubs, no gallops Respiratory: CTA bilaterally, no wheezing, no rhonchi Abdominal: Soft, NT, ND, bowel sounds + Extremities: no edema, no cyanosis    The results of significant diagnostics from this hospitalization (including imaging, microbiology, ancillary and laboratory) are listed below for reference.     Microbiology: Recent Results (from the past 240 hour(s))  Resp Panel by RT-PCR (Flu A&B, Covid) Nasopharyngeal Swab     Status: None   Collection Time: 07/05/21 11:31 AM   Specimen: Nasopharyngeal Swab; Nasopharyngeal(NP) swabs in vial transport medium  Result Value Ref Range Status   SARS Coronavirus 2 by RT PCR NEGATIVE NEGATIVE Final    Comment: (NOTE) SARS-CoV-2 target nucleic acids are NOT DETECTED.  The SARS-CoV-2 RNA is generally detectable in upper respiratory specimens during the acute phase of infection. The lowest concentration of SARS-CoV-2 viral copies this assay can detect  is 138 copies/mL. A negative result does not preclude SARS-Cov-2 infection and should not be used as the sole basis for treatment or other patient management decisions. A negative result may occur with  improper specimen collection/handling, submission of specimen other than nasopharyngeal swab, presence of viral mutation(s) within the areas targeted by this assay, and inadequate number of viral copies(<138 copies/mL). A negative result must be combined with clinical observations, patient history, and epidemiological information. The expected result is Negative.  Fact Sheet for Patients:  EntrepreneurPulse.com.au  Fact Sheet for Healthcare Providers:  IncredibleEmployment.be  This test is no t yet approved or cleared by the [place] FDA and  has been authorized for detection and/or diagnosis of SARS-CoV-2 by FDA under an Emergency Use Authorization (EUA). This EUA will remain  in effect (meaning this test can be used) for the duration of the COVID-19 declaration under Section 564(b)(1) of the Act, 21 U.S.C.section 360bbb-3(b)(1), unless the authorization is terminated  or revoked sooner.       Influenza A by PCR NEGATIVE NEGATIVE Final   Influenza B by PCR NEGATIVE NEGATIVE Final    Comment: (NOTE) The Xpert Xpress SARS-CoV-2/FLU/RSV plus assay is intended as an aid in the diagnosis of influenza from Nasopharyngeal swab specimens and should not be used as a sole basis for treatment. Nasal washings and aspirates are unacceptable for Xpert Xpress SARS-CoV-2/FLU/RSV testing.  Fact Sheet for Patients: EntrepreneurPulse.com.au  Fact Sheet for Healthcare Providers: IncredibleEmployment.be  This test is not yet approved or cleared by the [place] FDA and has been authorized for detection and/or diagnosis of SARS-CoV-2 by FDA under an Emergency Use Authorization (EUA). This EUA will remain  in effect  (meaning this test can be used) for the duration of the COVID-19 declaration under Section 564(b)(1) of the Act, 21 U.S.C. section 360bbb-3(b)(1), unless the authorization is terminated or revoked.  Performed at [facility], [place] [address], [place], [place] [address]      Labs: BNP (last 3 results) No results for input(s): BNP in the last 8760 hours. Basic Metabolic Panel: Recent Labs  Lab 07/05/21 1036 07/05/21 1142  NA 137 139  K 3.7 4.3  CL 102 102  CO2 27  --   GLUCOSE 108* 106*  BUN 10 10  CREATININE 0.67 0.80  CALCIUM 8.4*  --   MG 2.0  --   PHOS 2.9  --    Liver Function Tests: Recent Labs  Lab 07/05/21 1036  AST 16  ALT 16  ALKPHOS 103  BILITOT 0.9  PROT 7.3  ALBUMIN 3.7   No results for input(s): LIPASE, AMYLASE in the last 168 hours. No results for input(s): AMMONIA in the last 168 hours. CBC: Recent Labs  Lab 07/05/21 1036 07/05/21 1142  WBC 5.7  --   NEUTROABS 3.5  --   HGB 13.5 13.6  HCT 40.4 40.0  MCV 88.8  --   PLT 286  --    Cardiac Enzymes: No results for input(s): CKTOTAL, CKMB, CKMBINDEX, TROPONINI in the last 168 hours. BNP: Invalid input(s): POCBNP CBG: No results for input(s): GLUCAP in the last 168 hours. D-Dimer No results for input(s): DDIMER in the last 72 hours. Hgb A1c Recent Labs    07/06/21 0250  HGBA1C 5.3   Lipid Profile Recent Labs    07/06/21 0250  CHOL 144  HDL 45  LDLCALC 81  TRIG 90  CHOLHDL 3.2   Thyroid function studies Recent Labs    07/05/21 1833  TSH 0.864   Anemia work up No results for input(s): VITAMINB12, FOLATE, FERRITIN, TIBC, IRON, RETICCTPCT in the last 72 hours. Urinalysis    Component Value Date/Time   COLORURINE YELLOW 07/05/2021 [place] 07/05/2021 1514   LABSPEC 1.013 07/05/2021 1514   PHURINE 6.0 07/05/2021 1514   GLUCOSEU NEGATIVE 07/05/2021 1514   HGBUR NEGATIVE 07/05/2021 1514   BILIRUBINUR NEGATIVE 07/05/2021 1514   KETONESUR  NEGATIVE 07/05/2021 1514   PROTEINUR NEGATIVE 07/05/2021 1514   NITRITE NEGATIVE 07/05/2021 1514   LEUKOCYTESUR NEGATIVE 07/05/2021 1514   Sepsis Labs Invalid input(s): PROCALCITONIN,  WBC,  LACTICIDVEN Microbiology Recent Results (from the past 240 hour(s))  Resp Panel by RT-PCR (Flu A&B, Covid) Nasopharyngeal Swab     Status: None   Collection Time: 07/05/21 11:31 AM   Specimen: Nasopharyngeal Swab; Nasopharyngeal(NP) swabs in vial transport medium  Result Value Ref Range Status   SARS Coronavirus 2 by RT PCR NEGATIVE NEGATIVE Final    Comment: (NOTE) SARS-CoV-2 target nucleic acids are NOT DETECTED.  The SARS-CoV-2 RNA is generally detectable in upper respiratory specimens during the acute phase of infection. The lowest concentration of SARS-CoV-2 viral copies this assay can detect is 138 copies/mL. A negative result does not preclude SARS-Cov-2 infection and should not be used as the sole basis for treatment or other patient management decisions. A negative result may occur with  improper specimen collection/handling, submission of specimen other than nasopharyngeal swab, presence of viral mutation(s) within the areas targeted by this assay, and inadequate number of viral copies(<138 copies/mL). A negative result must be combined with clinical observations, patient history, and epidemiological information. The expected result is Negative.  Fact Sheet for Patients:  EntrepreneurPulse.com.au  Fact Sheet for Healthcare Providers:  IncredibleEmployment.be  This test is no t yet approved or cleared by the [place] FDA and  has been authorized for detection and/or diagnosis of SARS-CoV-2 by FDA under an Emergency Use Authorization (EUA). This EUA will remain  in effect (meaning this test can be used) for the duration of the COVID-19 declaration under Section 564(b)(1) of the Act, 21 U.S.C.section 360bbb-3(b)(1), unless the authorization  is terminated  or revoked sooner.       Influenza A by PCR NEGATIVE NEGATIVE Final   Influenza B by PCR NEGATIVE NEGATIVE Final    Comment: (NOTE) The Xpert Xpress SARS-CoV-2/FLU/RSV plus assay is intended as an aid in the diagnosis of influenza from Nasopharyngeal swab specimens and should not be used as a sole basis for treatment. Nasal washings and aspirates are unacceptable for Xpert Xpress SARS-CoV-2/FLU/RSV testing.  Fact Sheet for Patients: EntrepreneurPulse.com.au  Fact Sheet for Healthcare Providers: IncredibleEmployment.be  This test is not yet approved or cleared by the [place] FDA and has been authorized for detection and/or diagnosis of SARS-CoV-2 by FDA under an Emergency Use Authorization (EUA). This EUA will remain in effect (meaning this test can be used) for the duration of the COVID-19 declaration under Section 564(b)(1) of the Act, 21 U.S.C. section 360bbb-3(b)(1), unless the authorization is terminated or revoked.  Performed at [facility], [place] [address] [place], [place] [address]      Time coordinating discharge: Over 30 minutes  SIGNED:   Phillips Climes, MD  [facility] 07/06/2021, 4:30 PM Pager   If 7PM-7AM, please contact night-coverage [email or web address] Password [email or web address]

## 2021-07-06 NOTE — Evaluation (Signed)
Occupational Therapy Evaluation Patient Details Name: Kyle Chang MRN: [id number] DOB: [birth year] Today's Date: 07/06/2021   History of Present Illness 61 y.o. M admitted on1/26/23 due to R sided weakness and numbness. MRI shows acute L thalamic infarct. PMH significant for alcohol abuse, prostate cancer, history of prostatectomy, class II obesity.   Clinical Impression   Pt admitted for concerns listed above. 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 At this time, pt presents with some mild balance deficits and weakness, which do not appear to limit his ability to complete ADL's. Pt moving well with no visual or cognitive deficits. He has no further OT needs and acute OT will sign off.      Recommendations for follow up therapy are one component of a multi-disciplinary discharge planning process, led by the attending physician.  Recommendations may be updated based on patient status, additional functional criteria and insurance authorization.   Follow Up Recommendations  No OT follow up    Assistance Recommended at Discharge PRN  Patient can return home with the following      Functional Status Assessment  Patient has had a recent decline in their functional status and demonstrates the ability to make significant improvements in function in a reasonable and predictable amount of time.  Equipment Recommendations  None recommended by OT    Recommendations for Other Services       Precautions / Restrictions Precautions Precautions: Fall Restrictions Weight Bearing Restrictions: No      Mobility Bed Mobility Overal bed mobility: Modified Independent                  Transfers Overall transfer level: Modified independent Equipment used: None                      Balance Overall balance assessment: Mild deficits observed, not formally tested                                          ADL either performed or assessed with clinical judgement   ADL Overall ADL's : At baseline;Modified independent                                       General ADL Comments: No difficulties, reports he feels almost back to normal except for the sensation loss on RLE     Vision Baseline Vision/History: 0 No visual deficits Ability to See in Adequate Light: 0 Adequate Patient Visual Report: No change from baseline Vision Assessment?: No apparent visual deficits     Perception     Praxis      Pertinent Vitals/Pain Pain Assessment Pain Assessment: No/denies pain     Hand Dominance Right   Extremity/Trunk Assessment Upper Extremity Assessment Upper Extremity Assessment: Overall WFL for tasks assessed   Lower Extremity Assessment Lower Extremity Assessment: Defer to PT evaluation   Cervical / Trunk Assessment Cervical / Trunk Assessment: Normal   Communication Communication Communication: No difficulties   Cognition Arousal/Alertness: Awake/alert Behavior During Therapy: WFL for tasks assessed/performed Overall Cognitive Status: Within Functional Limits for tasks assessed  General Comments  VSS on RA    Exercises     Shoulder Instructions      Home Living Family/patient expects to be discharged to:: Private residence Living Arrangements: Non-relatives/Friends Available Help at Discharge: Friend(s) Type of Home: House Home Access: Level entry     Home Layout: One level     Bathroom Shower/Tub: Teacher, early years/pre: Standard     Home Equipment: None          Prior Functioning/Environment Prior Level of Function : Independent/Modified Independent;Working/employed;Driving                        OT Problem List: Decreased strength;Impaired sensation;Decreased safety awareness      OT Treatment/Interventions:      OT Goals(Current goals can be  found in the care plan section) Acute Rehab OT Goals Patient Stated Goal: To go home OT Goal Formulation: With patient Time For Goal Achievement: 07/20/21 Potential to Achieve Goals: Good  OT Frequency:      Co-evaluation              AM-PAC OT "6 Clicks" Daily Activity     Outcome Measure Help from another person eating meals?: None Help from another person taking care of personal grooming?: None Help from another person toileting, which includes using toliet, bedpan, or urinal?: None Help from another person bathing (including washing, rinsing, drying)?: None Help from another person to put on and taking off regular upper body clothing?: None Help from another person to put on and taking off regular lower body clothing?: None 6 Click Score: 24   End of Session Nurse Communication: Mobility status  Activity Tolerance: Patient tolerated treatment well Patient left: in bed;with call bell/phone within reach  OT Visit Diagnosis: Unsteadiness on feet (R26.81);Other abnormalities of gait and mobility (R26.89);Muscle weakness (generalized) (M62.81)                Time: 3893-7342 OT Time Calculation (min): 14 min Charges:  OT General Charges $OT Visit: 1 Visit OT Evaluation $OT Eval Moderate Complexity: 1 Mod  Taunia Frasco H., OTR/L Acute Rehabilitation  Patrecia Veiga Elane Yolanda Bonine 07/06/2021, 2:54 PM

## 2021-07-06 NOTE — Evaluation (Signed)
Physical Therapy Evaluation and Discharge Patient Details Name: Kyle Chang MRN: [id number] DOB: [birth year] Today's Date: 07/06/2021  History of Present Illness  61 y.o. M admitted on1/26/23 due to R sided weakness and numbness. MRI shows acute L thalamic infarct. PMH significant for alcohol abuse, prostate cancer, history of prostatectomy, class II obesity.   Clinical Impression  Patient evaluated by Physical Therapy with no further acute PT needs identified. All education has been completed and the patient has no further questions. At the time of PT eval pt was able to perform transfers and ambulation with gross modified independence and no AD. He scored 19/24 on the DGI. Pt states he feels close to baseline of function. We discussed an outpatient PT evaluation if pt does not continue to improve over the next week or 2. See below for any follow-up Physical Therapy or equipment needs. PT is signing off. Thank you for this referral.        Recommendations for follow up therapy are one component of a multi-disciplinary discharge planning process, led by the attending physician.  Recommendations may be updated based on patient status, additional functional criteria and insurance authorization.  Follow Up Recommendations Outpatient PT (If pt does not continue to improve)    Assistance Recommended at Discharge PRN  Patient can return home with the following  Assist for transportation    Equipment Recommendations None recommended by PT  Recommendations for Other Services       Functional Status Assessment Patient has had a recent decline in their functional status and demonstrates the ability to make significant improvements in function in a reasonable and predictable amount of time.     Precautions / Restrictions Precautions Precautions: Fall Restrictions Weight Bearing Restrictions: No      Mobility  Bed Mobility               General bed mobility comments: Pt was  received sitting up EOB.    Transfers Overall transfer level: Modified independent Equipment used: None               General transfer comment: No unsteadiness or LOB noted.    Ambulation/Gait Ambulation/Gait assistance: Modified independent (Device/Increase time) Gait Distance (Feet): 500 Feet Assistive device: None Gait Pattern/deviations: Step-through pattern, Antalgic Gait velocity: Decreased Gait velocity interpretation: >2.62 ft/sec, indicative of community ambulatory   General Gait Details: Mildly antalgic with decreased coordination of heel strike on the RLE.  Stairs Stairs: Yes Stairs assistance: Supervision Stair Management: Two rails, Alternating pattern, Forwards Number of Stairs: 5 General stair comments: Practice stairs in the rehab gym as a part of the DGI. Pt slow and guarded but states this is baseline.  Wheelchair Mobility    Modified Rankin (Stroke Patients Only) Modified Rankin (Stroke Patients Only) Pre-Morbid Rankin Score: No significant disability Modified Rankin: Moderate disability     Balance Overall balance assessment: Mild deficits observed, not formally tested                               Standardized Balance Assessment Standardized Balance Assessment : Dynamic Gait Index   Dynamic Gait Index Level Surface: Mild Impairment Change in Gait Speed: Normal Gait with Horizontal Head Turns: Mild Impairment Gait with Vertical Head Turns: Mild Impairment Gait and Pivot Turn: Normal Step Over Obstacle: Mild Impairment Step Around Obstacles: Normal Steps: Mild Impairment Total Score: 19       Pertinent Vitals/Pain Pain Assessment Pain Assessment: No/denies pain  Home Living Family/patient expects to be discharged to:: Private residence Living Arrangements: Non-relatives/Friends Available Help at Discharge: Friend(s) Type of Home: House Home Access: Level entry       Home Layout: One level Home Equipment:  None      Prior Function Prior Level of Function : Independent/Modified Independent;Working/employed;Driving                     Hand Dominance   Dominant Hand: Right    Extremity/Trunk Assessment   Upper Extremity Assessment Upper Extremity Assessment: RUE deficits/detail RUE Deficits / Details: Mild weakness in biceps, shoulder flexion (4+/5) but overall WFL. Pt with baseline pinky ROM and sensation deficits s/p injury.    Lower Extremity Assessment Lower Extremity Assessment: RLE deficits/detail RLE Deficits / Details: Mild weakness in quads, hamstrings and hip flexors (4+/5). Mildly decreased coordination noted during ambulation but testing did not reveal any obvious coordation deficits.    Cervical / Trunk Assessment Cervical / Trunk Assessment: Normal  Communication   Communication: No difficulties  Cognition Arousal/Alertness: Awake/alert Behavior During Therapy: WFL for tasks assessed/performed Overall Cognitive Status: Within Functional Limits for tasks assessed                                          General Comments General comments (skin integrity, edema, etc.): VSS on RA    Exercises     Assessment/Plan    PT Assessment Patient does not need any further PT services  PT Problem List Decreased strength;Decreased activity tolerance;Decreased balance;Decreased mobility;Decreased knowledge of use of DME;Decreased safety awareness;Decreased knowledge of precautions;Pain       PT Treatment Interventions      PT Goals (Current goals can be found in the Care Plan section)  Acute Rehab PT Goals Patient Stated Goal: Home today PT Goal Formulation: All assessment and education complete, DC therapy    Frequency       Co-evaluation               AM-PAC PT "6 Clicks" Mobility  Outcome Measure Help needed turning from your back to your side while in a flat bed without using bedrails?: None Help needed moving from lying on your  back to sitting on the side of a flat bed without using bedrails?: None Help needed moving to and from a bed to a chair (including a wheelchair)?: None Help needed standing up from a chair using your arms (e.g., wheelchair or bedside chair)?: None Help needed to walk in hospital room?: None Help needed climbing 3-5 steps with a railing? : A Little 6 Click Score: 23    End of Session Equipment Utilized During Treatment: Gait belt Activity Tolerance: Patient tolerated treatment well Patient left: in bed;with call bell/phone within reach Nurse Communication: Mobility status PT Visit Diagnosis: Other symptoms and signs involving the nervous system (R29.898)    Time: 2010-0712 PT Time Calculation (min) (ACUTE ONLY): 18 min   Charges:   PT Evaluation $PT Eval Moderate Complexity: 1 Mod          Rolinda Roan, PT, DPT Acute Rehabilitation Services Pager: [phone] Office: [phone]   Thelma Comp 07/06/2021, 4:03 PM

## 2021-07-06 NOTE — Progress Notes (Signed)
Patient ready for discharge; discharge instructions given and reviewed; Rx sent electronically. Patient dressed and discharged out via wheelchair; accompanied home by a friend.

## 2021-07-06 NOTE — Evaluation (Signed)
Speech Language Pathology Evaluation Patient Details Name: Kyle Chang MRN: [id number] DOB: Sep 29, 1960 Today's Date: 07/06/2021 Time: 0626-9485 SLP Time Calculation (min) (ACUTE ONLY): 12 min  Problem List:  Patient Active Problem List   Diagnosis Date Noted   Cerebrovascular accident (CVA) of left thalamus ([place]) 07/05/2021   Class 2 obesity 07/05/2021   Alcohol abuse 07/05/2021   Hypocalcemia 07/05/2021   Essential hypertension 07/05/2021   Past Medical History:  Past Medical History:  Diagnosis Date   Alcohol abuse    Cancer ([place])    Class 2 obesity 07/05/2021   Prostate cancer [facility])    Past Surgical History:  Past Surgical History:  Procedure Laterality Date   APPENDECTOMY     PROSTATECTOMY     PROSTATECTOMY     HPI:  Pt is a 61 y.o. male admitted on 1/26 due to R sided weakness and numbness. MRI showed acute L thalamic infarct. PMH significant for alcohol abuse, prostate cancer, history of prostatectomy, class II obesity.   Assessment / Plan / Recommendation Clinical Impression  Pt participated in speech/language/cognition evaluation evaluation. 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 He denied any baseline or acute deficits in speech, language, or cognition. The [facility] Mental Status Examination was completed to evaluate the pt's cognitive-linguistic skills. He achieved a score of 28/30 which is within the normal limits of 27 or more out of 30. No speech or language deficits were noted and his performance on informal cognitive-linguistic tasks was within functional limits. Further skilled SLP services are not clinically indicated at this time. Pt was educated regarding this and he verbalized understanding as well as agreement with plan of care.    SLP Assessment  SLP Recommendation/Assessment: Patient does not need any further Speech [place] Pathology Services SLP Visit  Diagnosis: Cognitive communication deficit (R41.841)    Recommendations for follow up therapy are one component of a multi-disciplinary discharge planning process, led by the attending physician.  Recommendations may be updated based on patient status, additional functional criteria and insurance authorization.    Follow Up Recommendations  No SLP follow up    Assistance Recommended at Discharge  None  Functional Status Assessment Patient has not had a recent decline in their functional status  Frequency and Duration           SLP Evaluation Cognition  Overall Cognitive Status: Within Functional Limits for tasks assessed Arousal/Alertness: Awake/alert Orientation Level: Oriented X4 Year: 2023 Month: January Day of Week: Correct Attention: Focused Focused Attention: Appears intact Awareness: Appears intact Problem Solving: Appears intact (math: 3/3) Executive Function: Sequencing;Organizing Sequencing: Appears intact (clock drawing: 4/4) Organizing: Impaired Organizing Impairment: Verbal complex (backward digit span: 1/2 (difficulty with 4 digits))       Comprehension  Auditory Comprehension Overall Auditory Comprehension: Appears within functional limits for tasks assessed Yes/No Questions: Within Functional Limits Commands: Within Functional Limits Conversation: Complex    Expression Expression Primary Mode of Expression: Verbal Verbal Expression Overall Verbal Expression: Appears within functional limits for tasks assessed Initiation: No impairment Level of Generative/Spontaneous Verbalization: Conversation Repetition: No impairment Naming: No impairment Pragmatics: No impairment Written Expression Dominant Hand: Right   Oral / Motor  Oral Motor/Sensory Function Overall Oral Motor/Sensory Function: Within functional limits Motor Speech Overall Motor Speech: Appears within functional limits for tasks assessed Respiration: Within functional limits Phonation:  Normal Resonance: Within functional limits Articulation: Within functional limitis Intelligibility: Intelligible Motor Planning: Witnin functional limits Motor Speech Errors:  Not applicable           Nesa Distel I. Hardin Negus, [place], [place] Office number [phone] Pager [place] 07/06/2021, 4:47 PM

## 2021-07-06 NOTE — TOC CAGE-AID Note (Signed)
Transition of Care [facility]) - CAGE-AID Screening   Patient Details  Name: Kyle Chang MRN: [id number] Date of Birth: 03-Jun-1961  Transition of [facility]) CM/SW Contact:    Keirstyn Aydt C Tarpley-Carter, [place] Phone Number: 07/06/2021, 8:43 AM   Clinical Narrative: Pt participated in [place].  Pt stated he does not use substance, he does drink ETOH.  Pt was offered resources, due to usage of ETOH.     Mitsy Owen Tarpley-Carter, MSW, LCSW-A Pronouns:  She/Her/Hers [place] Transitions of Care Clinical Social Worker Direct Number:  [phone] [email or web address]    CAGE-AID Screening:    Have You Ever Felt You Ought to Cut Down on Your Drinking or Drug Use?: No Have People Annoyed You By [employer] Your Drinking Or Drug Use?: No Have You Felt Bad Or Guilty About Your Drinking Or Drug Use?: No Have You Ever Had a Drink or Used Drugs First Thing In The Morning to Steady Your Nerves or to Get Rid of a Hangover?: No CAGE-AID Score: 0  Substance Abuse Education Offered: Yes  Substance abuse interventions: Scientist, clinical (histocompatibility and immunogenetics)

## 2021-07-06 NOTE — Progress Notes (Signed)
Echocardiogram 2D Echocardiogram has been performed.  Jefferey Pica 07/06/2021, 9:40 AM

## 2021-07-06 NOTE — TOC Initial Note (Signed)
Transition of Care [facility]) - Initial/Assessment Note    Patient Details  Name: Kyle Chang MRN: [id number] Date of Birth: 1961-03-21  Transition of Care [facility]) CM/SW Contact:    Pollie Friar, RN Phone Number: 07/06/2021, 3:07 PM  Clinical Narrative:                 Patient lives at home with a friend that he states can provide intermittent supervision. No f/u per OT.  Pt denies issues with home medications or transportation. PCP: currently doesn't have one but has a tele conference with Dr Ernie Hew in February and wants to be established with that MD. CM will provide another option if he is declined for Dr Ernie Hew.  TOC following.  Expected Discharge Plan: Home/Self Care Barriers to Discharge: Continued Medical Work up   Patient Goals and CMS Choice        Expected Discharge Plan and Services Expected Discharge Plan: Home/Self Care   Discharge Planning Services: CM Consult   Living arrangements for the past 2 months: Single Family Home                                      Prior Living Arrangements/Services Living arrangements for the past 2 months: Single Family Home Lives with:: Friends Patient language and need for interpreter reviewed:: Yes Do you feel safe going back to the place where you live?: Yes            Criminal Activity/Legal Involvement Pertinent to Current Situation/Hospitalization: No - Comment as needed  Activities of Daily Living Home Assistive Devices/Equipment: None ADL Screening (condition at time of admission) Patient's cognitive ability adequate to safely complete daily activities?: Yes Is the patient deaf or have difficulty hearing?: No Does the patient have difficulty seeing, even when wearing glasses/contacts?: No Does the patient have difficulty concentrating, remembering, or making decisions?: No Patient able to express need for assistance with ADLs?: Yes Does the patient have difficulty dressing or bathing?: No Independently  performs ADLs?: Yes (appropriate for developmental age) Does the patient have difficulty walking or climbing stairs?: Yes Weakness of Legs: Right Weakness of Arms/Hands: None  Permission Sought/Granted                  Emotional Assessment Appearance:: Appears stated age Attitude/Demeanor/Rapport: Engaged Affect (typically observed): Accepting Orientation: : Oriented to Self, Oriented to Place, Oriented to  Time, Oriented to Situation   Psych Involvement: No (comment)  Admission diagnosis:  Cerebrovascular accident (CVA), unspecified mechanism ([place]) [I63.9] Cerebrovascular accident (CVA) of left thalamus ([place]) [I63.81] Patient Active Problem List   Diagnosis Date Noted   Cerebrovascular accident (CVA) of left thalamus ([place]) 07/05/2021   Class 2 obesity 07/05/2021   Alcohol abuse 07/05/2021   Hypocalcemia 07/05/2021   Essential hypertension 07/05/2021   PCP:  Pcp, No Pharmacy:   [id number] [address], [address] AT [facility] [address] [place] [address] Phone: [phone] Fax: [phone]     Social Determinants of Health (SDOH) Interventions    Readmission Risk Interventions No flowsheet data found.

## 2021-07-06 NOTE — TOC Transition Note (Signed)
Transition of Care [facility]) - CM/SW Discharge Note   Patient Details  Name: Kyle Chang MRN: [id number] Date of Birth: 09/09/1960  Transition of Care [facility]) CM/SW Contact:  Geralynn Ochs, LCSW Phone Number: 07/06/2021, 3:53 PM   Clinical Narrative:   Patient discharging home. Recommendation for outpatient PT if needed, pending progress. Referral sent to neuro rehab and information placed on AVS.    Final next level of care: Home/Self Care Barriers to Discharge: Barriers Resolved   Patient Goals and CMS Choice        Discharge Placement                       Discharge Plan and Services   Discharge Planning Services: CM Consult                                 Social Determinants of Health (SDOH) Interventions     Readmission Risk Interventions No flowsheet data found.

## 2021-07-06 NOTE — Discharge Instructions (Signed)
Follow with Primary MD in 7 days  ° °Get CBC, CMP,  checked  by Primary MD next visit.  ° ° °Activity: As tolerated with Full fall precautions use walker/cane & assistance as needed ° ° °Disposition Home  ° ° °Diet: Heart Healthy  ° ° °On your next visit with your primary care physician please Get Medicines reviewed and adjusted. ° ° °Please request your Prim.MD to go over all Hospital Tests and Procedure/Radiological results at the follow up, please get all Hospital records sent to your Prim MD by signing hospital release before you go home. ° ° °If you experience worsening of your admission symptoms, develop shortness of breath, life threatening emergency, suicidal or homicidal thoughts you must seek medical attention immediately by calling 911 or calling your MD immediately  if symptoms less severe. ° °You Must read complete instructions/literature along with all the possible adverse reactions/side effects for all the Medicines you take and that have been prescribed to you. Take any new Medicines after you have completely understood and accpet all the possible adverse reactions/side effects.  ° °Do not drive, operating heavy machinery, perform activities at heights, swimming or participation in water activities or provide baby sitting services if your were admitted for syncope or siezures until you have seen by Primary MD or a Neurologist and advised to do so again. ° °Do not drive when taking Pain medications.  ° ° °Do not take more than prescribed Pain, Sleep and Anxiety Medications ° °Special Instructions: If you have smoked or chewed Tobacco  in the last 2 yrs please stop smoking, stop any regular Alcohol  and or any Recreational drug use. ° °Wear Seat belts while driving. ° ° °Please note ° °You were cared for by a hospitalist during your hospital stay. If you have any questions about your discharge medications or the care you received while you were in the hospital after you are discharged, you can call the  unit and asked to speak with the hospitalist on call if the hospitalist that took care of you is not available. Once you are discharged, your primary care physician will handle any further medical issues. Please note that NO REFILLS for any discharge medications will be authorized once you are discharged, as it is imperative that you return to your primary care physician (or establish a relationship with a primary care physician if you do not have one) for your aftercare needs so that they can reassess your need for medications and monitor your lab values. ° °

## 2021-07-20 ENCOUNTER — Ambulatory Visit: Payer: BC Managed Care – PPO | Attending: Internal Medicine

## 2021-07-20 ENCOUNTER — Other Ambulatory Visit: Payer: Self-pay

## 2021-07-20 DIAGNOSIS — I639 Cerebral infarction, unspecified: Secondary | ICD-10-CM | POA: Insufficient documentation

## 2021-07-20 DIAGNOSIS — R2689 Other abnormalities of gait and mobility: Secondary | ICD-10-CM | POA: Insufficient documentation

## 2021-07-20 NOTE — Therapy (Signed)
[facility] [address] [place], [place], [address] Phone: [phone]   Fax:  [phone]  Physical Therapy Evaluation  Patient Details  Name: Kyle Chang MRN: [id number] Date of Birth: 1961/03/20 Referring Provider (PT): Elgergawy, Silver Huguenin, MD   Encounter Date: 07/20/2021   PT End of Session - 07/20/21 1323     Visit Number 1    Number of Visits 1    PT Start Time 1230    PT Stop Time 1315    PT Time Calculation (min) 45 min    Activity Tolerance Patient tolerated treatment well    Behavior During Therapy [facility] for tasks assessed/performed             Past Medical History:  Diagnosis Date   Alcohol abuse    Cancer ([place])    Class 2 obesity 07/05/2021   Prostate cancer [facility])     Past Surgical History:  Procedure Laterality Date   APPENDECTOMY     PROSTATECTOMY     PROSTATECTOMY      There were no vitals filed for this visit.    Subjective Assessment - 07/20/21 1236     Subjective Pt reports he woke up with R UE and R LE weakness on 07/05/21. He went to work and noted changes with his driving and when he was walking around at work. he told his supervisor that he might be havig stroke. He called ambulance and went to ED. He was discharge don 07/06/21. He is doing okay at home. He is able to drive. Pt thinks he is about 90-95% back to normal. Pt is reporting mild tingling in R UE and R LE.                [facility] PT Assessment - 07/20/21 1239       Assessment   Medical Diagnosis CVA    Referring Provider (PT) Elgergawy, Silver Huguenin, MD      Precautions   Precautions None      Restrictions   Weight Bearing Restrictions No      Balance Screen   Has the patient fallen in the past 6 months No      Prior Function   Vocation Full time employment    Psychologist, counselling, climbing ladders, using tools, walking      Cognition   Overall Cognitive Status Within Functional  Limits for tasks assessed      ROM / Strength   AROM / PROM / Strength Strength      Strength   Overall Strength Comments R gip strength 80lbs, L grip strength 90lbs (avg of 2 trials)      Standardized Balance Assessment   Standardized Balance Assessment Five Times Sit to Stand    Five times sit to stand comments  16 sec no HHA      Functional Gait  Assessment   Gait assessed  Yes    Gait Level Surface Walks 20 ft in less than 5.5 sec, no assistive devices, good speed, no evidence for imbalance, normal gait pattern, deviates no more than 6 in outside of the 12 in walkway width.    Change in Gait Speed Able to smoothly change walking speed without loss of balance or gait deviation. Deviate no more than 6 in outside of the 12 in walkway width.    Gait with Horizontal Head Turns Performs head turns smoothly with no change in gait. Deviates no more than 6 in outside 12 in  walkway width    Gait with Vertical Head Turns Performs head turns with no change in gait. Deviates no more than 6 in outside 12 in walkway width.    Gait and Pivot Turn Pivot turns safely within 3 sec and stops quickly with no loss of balance.    Step Over Obstacle Is able to step over 2 stacked shoe boxes taped together (9 in total height) without changing gait speed. No evidence of imbalance.    Gait with Narrow Base of Support Ambulates 4-7 steps.    Gait with Eyes Closed Walks 20 ft, no assistive devices, good speed, no evidence of imbalance, normal gait pattern, deviates no more than 6 in outside 12 in walkway width. Ambulates 20 ft in less than 7 sec.    Ambulating Backwards Walks 20 ft, no assistive devices, good speed, no evidence for imbalance, normal gait    Steps Two feet to a stair, must use rail.    Total Score 26                        Objective measurements completed on examination: See above findings.                PT Education - 07/20/21 1323     Education Details Pt educated  on measuring his BP twice a day and keeping log of his readings. Pt was recommended to follow up with PCP to make sure his BP is managed better with medication to reduce risk factors for recurring strokes. Patient educated on benefits of moderate intensity exercises for 30 min a day. Pt educated on starting with 4 min of walking and adding 2 min every week until he can walk for 30 min a day. Reviewed findings from session. Reviewed HEP with patient.    Person(s) Educated Patient    Methods Explanation    Comprehension Verbalized understanding                 PT Long Term Goals - 07/20/21 1324       PT LONG TERM GOAL #1   Title no LTG established.                    Plan - 07/20/21 1325     Clinical Impression Statement Patient is a 61 y.o. male who was seen today for physical therapy evaluation and treatment for gait abnormality after recent CVA. 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 patient demonstrates functional gait and balance (Functional gait assessment). patient was given HEP to continue to work on improveing functional strength of LE and walking program. Patient currently does not require skilled PT and will be discharged from skilled PT with home exercise program for management.    Personal Factors and Comorbidities Comorbidity 2    Comorbidities alcohol abuse, prostate cancer, history of prostatectomy, class II obesity.    Stability/Clinical Decision Making Stable/Uncomplicated    Clinical Decision Making Low    Rehab Potential Excellent    PT Next Visit Plan Discharge    PT Home Exercise Plan [phone]    Consulted and Agree with Plan of Care Patient             Patient will benefit from skilled therapeutic intervention in order to improve the following deficits and impairments:  Decreased strength, Abnormal gait  Visit Diagnosis: Other abnormalities of  gait and  mobility     Problem List Patient Active Problem List   Diagnosis Date Noted   Cerebrovascular accident (CVA) of left thalamus ([place]) 07/05/2021   Class 2 obesity 07/05/2021   Alcohol abuse 07/05/2021   Hypocalcemia 07/05/2021   Essential hypertension 07/05/2021    Kerrie Pleasure, PT 07/20/2021, 1:29 PM  [facility] [address] [place], [place], [address] Phone: [phone]   Fax:  [phone]  Name: Kyle Chang MRN: [id number] Date of Birth: [birth year]

## 2021-07-25 DIAGNOSIS — M170 Bilateral primary osteoarthritis of knee: Secondary | ICD-10-CM | POA: Diagnosis not present

## 2021-07-25 DIAGNOSIS — I10 Essential (primary) hypertension: Secondary | ICD-10-CM | POA: Diagnosis not present

## 2021-07-25 DIAGNOSIS — Z1211 Encounter for screening for malignant neoplasm of colon: Secondary | ICD-10-CM | POA: Diagnosis not present

## 2021-07-25 DIAGNOSIS — E669 Obesity, unspecified: Secondary | ICD-10-CM | POA: Diagnosis not present

## 2021-07-31 ENCOUNTER — Ambulatory Visit
Admission: RE | Admit: 2021-07-31 | Discharge: 2021-07-31 | Disposition: A | Discharge status: Home / Self Care | Payer: Self-pay | Source: Ambulatory Visit | Attending: Nurse Practitioner | Admitting: Nurse Practitioner

## 2021-07-31 ENCOUNTER — Other Ambulatory Visit: Payer: Self-pay | Admitting: Nurse Practitioner

## 2021-07-31 ENCOUNTER — Ambulatory Visit
Admission: RE | Admit: 2021-07-31 | Discharge: 2021-07-31 | Disposition: A | Discharge status: Home / Self Care | Payer: BC Managed Care – PPO | Source: Ambulatory Visit | Attending: Nurse Practitioner | Admitting: Nurse Practitioner

## 2021-07-31 DIAGNOSIS — Z8673 Personal history of transient ischemic attack (TIA), and cerebral infarction without residual deficits: Secondary | ICD-10-CM | POA: Diagnosis not present

## 2021-07-31 DIAGNOSIS — M1990 Unspecified osteoarthritis, unspecified site: Secondary | ICD-10-CM

## 2021-07-31 DIAGNOSIS — M170 Bilateral primary osteoarthritis of knee: Secondary | ICD-10-CM | POA: Diagnosis not present

## 2021-07-31 DIAGNOSIS — I10 Essential (primary) hypertension: Secondary | ICD-10-CM | POA: Diagnosis not present

## 2021-07-31 DIAGNOSIS — M1711 Unilateral primary osteoarthritis, right knee: Secondary | ICD-10-CM | POA: Diagnosis not present

## 2021-07-31 DIAGNOSIS — E782 Mixed hyperlipidemia: Secondary | ICD-10-CM | POA: Diagnosis not present

## 2021-07-31 DIAGNOSIS — M25561 Pain in right knee: Secondary | ICD-10-CM | POA: Diagnosis not present

## 2021-07-31 DIAGNOSIS — M25562 Pain in left knee: Secondary | ICD-10-CM | POA: Diagnosis not present

## 2021-07-31 IMAGING — CR DG KNEE COMPLETE 4+V*L*
4 series · 4 of 4 positions shown · non-contrast
Comparison: MRI [DATE]

CLINICAL DATA: Bilateral knee pain. History of chondroid tumor in
inferior left femur.

EXAM:
LEFT KNEE - COMPLETE 4+ VIEW

[w knee ap left]
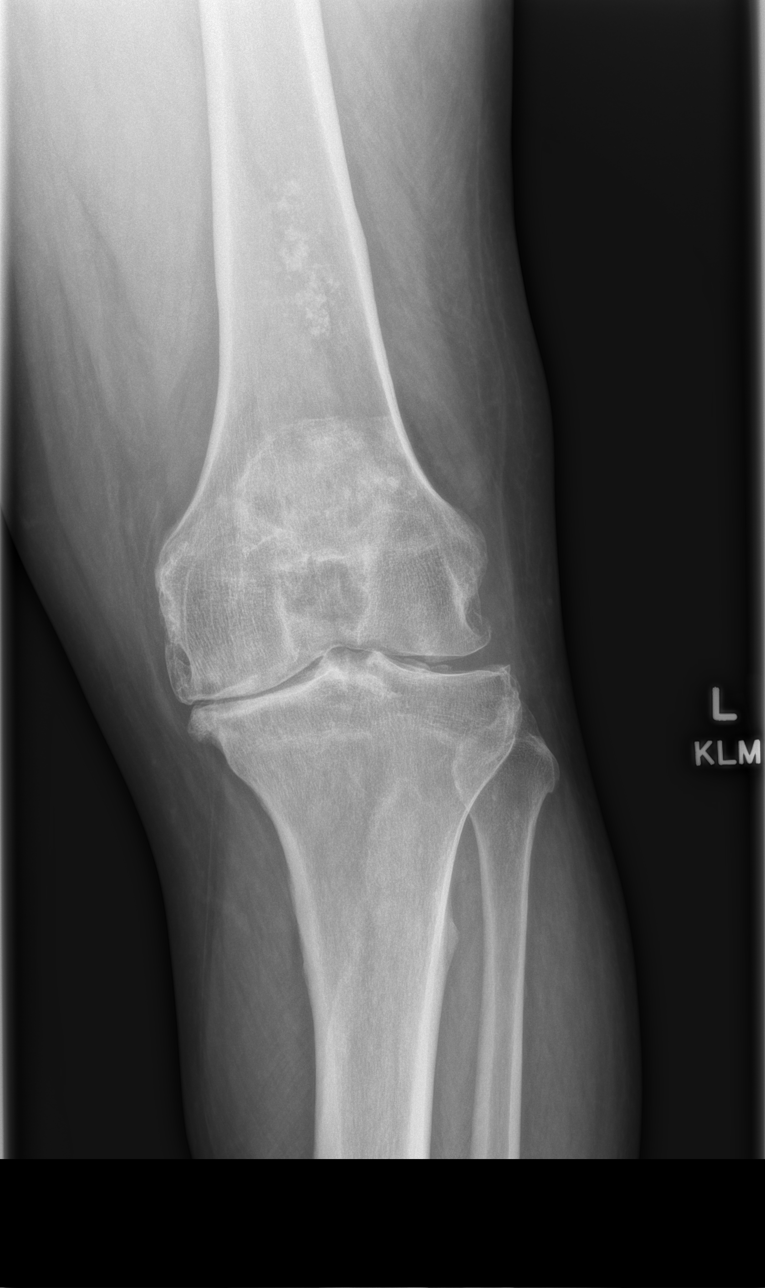

[w knee lat left]
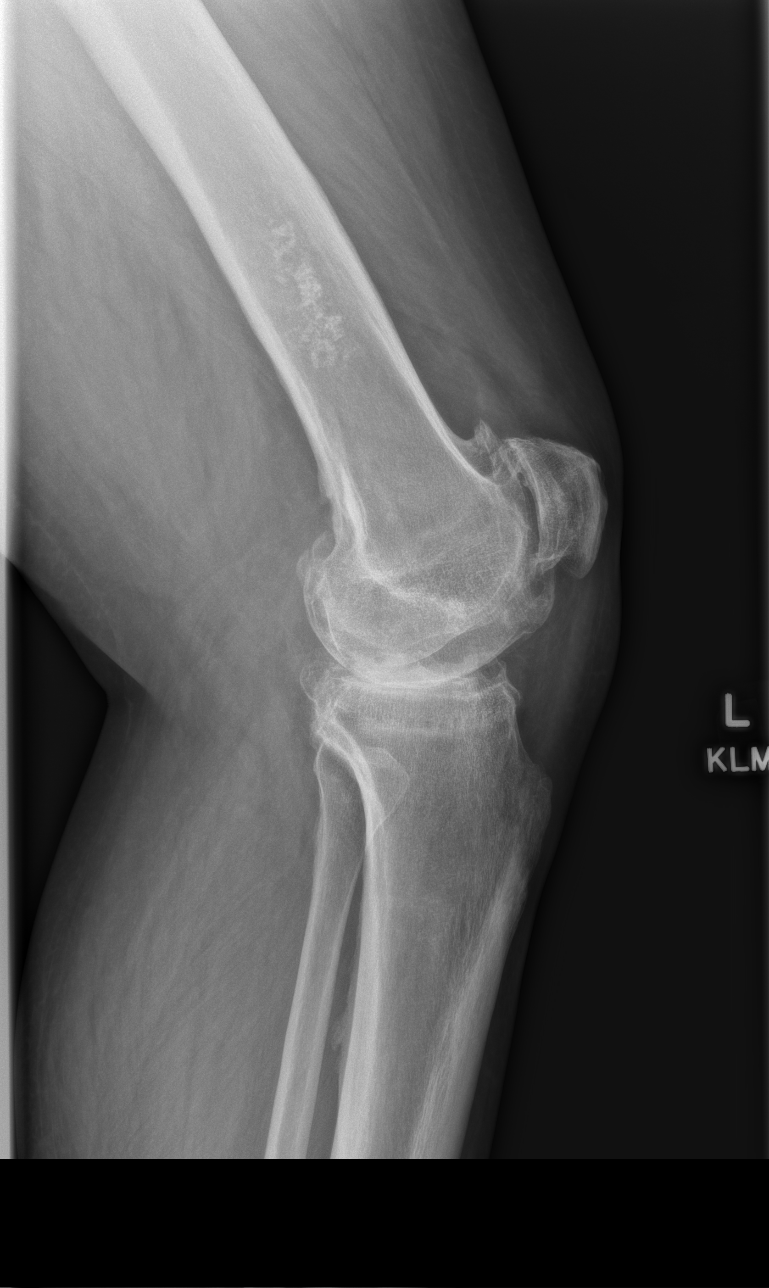

[w knee tunnel pa left]
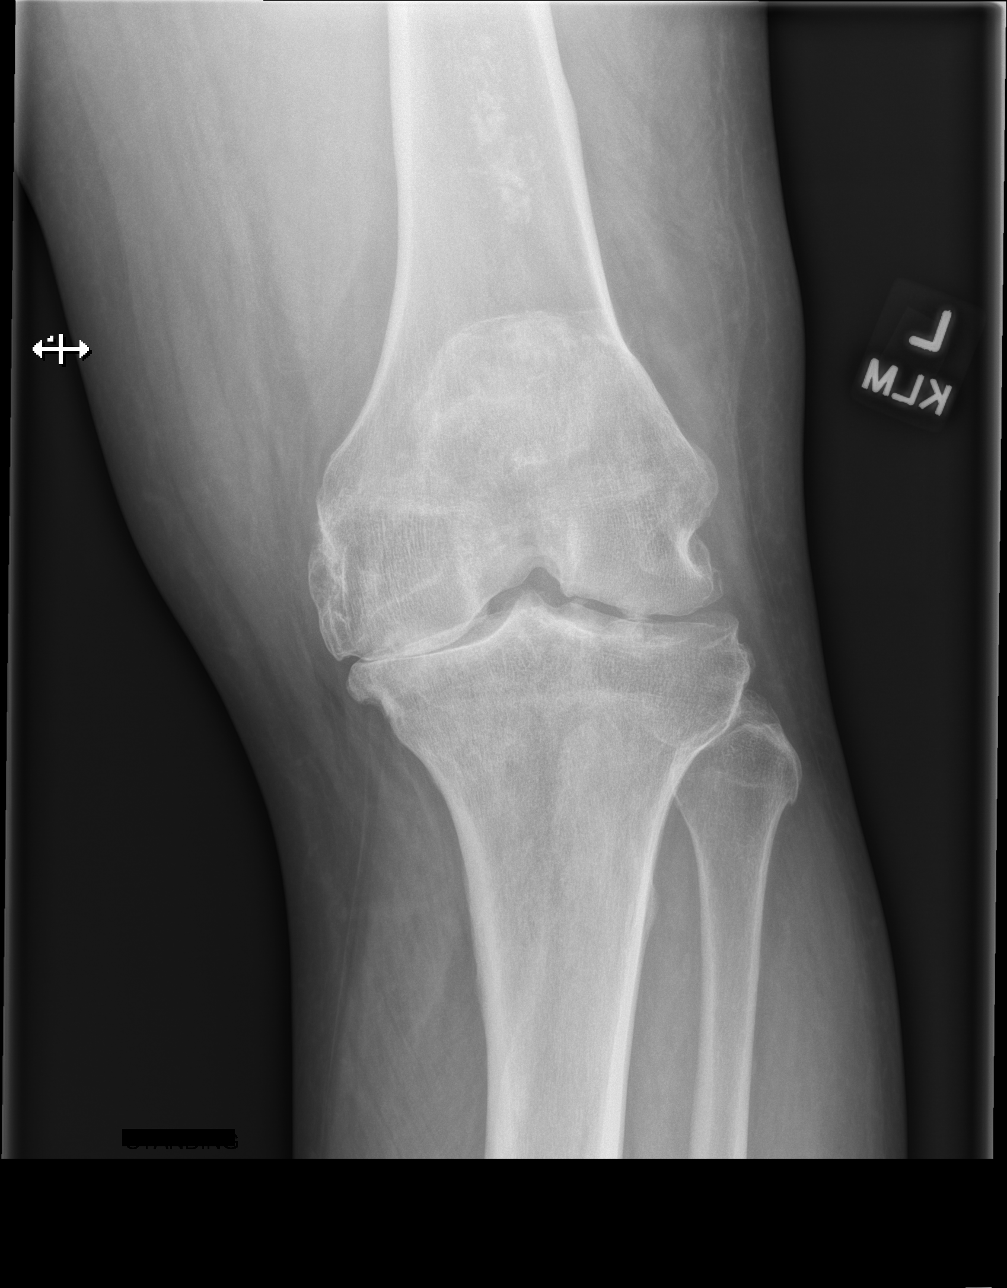

[x knee sunrise left]
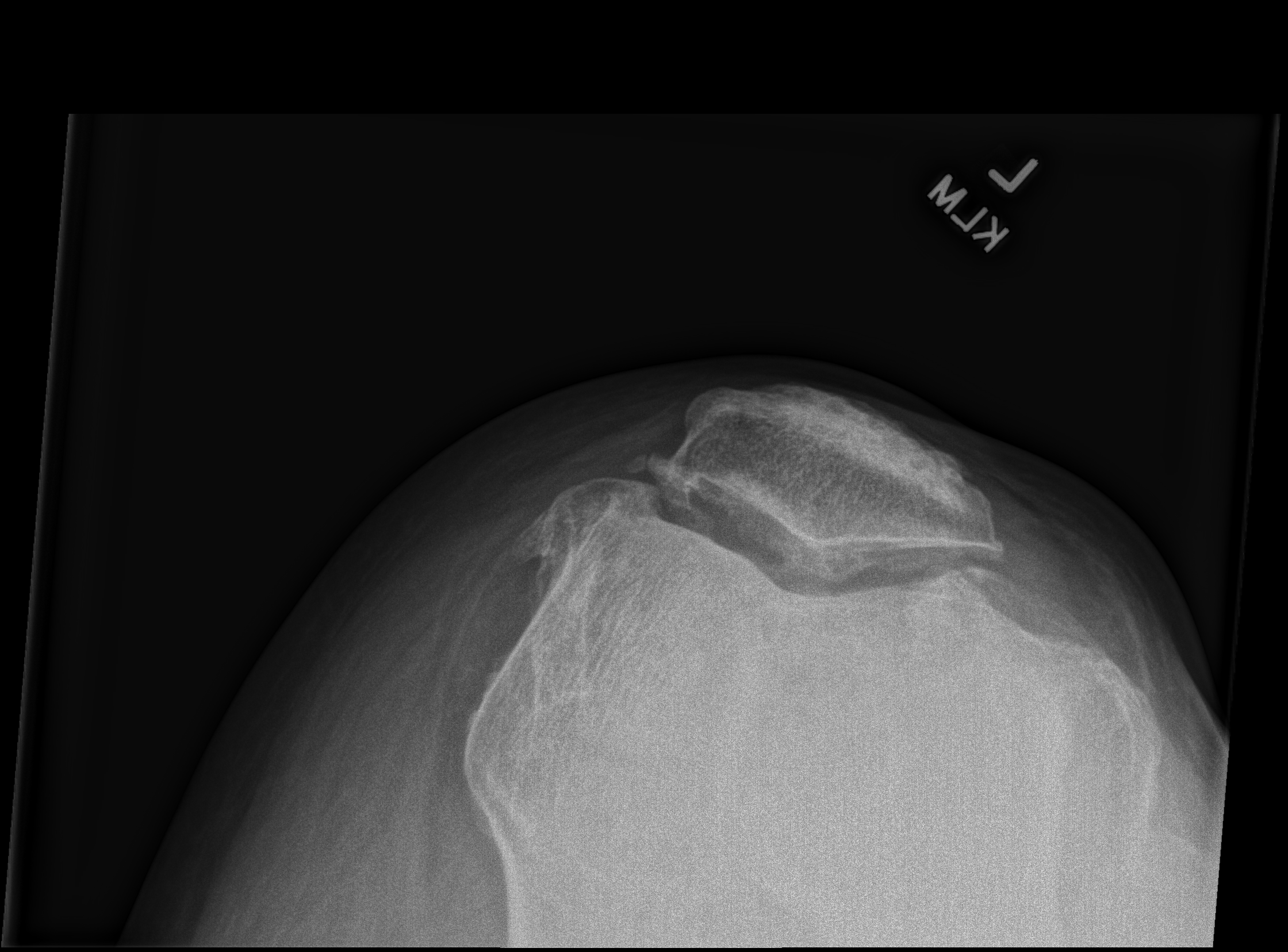

[4 of 4 positions shown; findings below may reference images not displayed]

FINDINGS: Severe tricompartmental degenerative changes, most prominent in the
medial compartment with with loss of joint space. No fracture or
dislocation. No joint effusion. An enchondroma is identified in the
distal femur correlating with previous MRI. Suggested loose bodies
in the joint best seen on the standing tunnel view.
IMPRESSION: 1. Significant tricompartmental degenerative changes, most prominent
in the medial compartment with significant loss of joint space.
There is near complete loss of joint space on the standing tunnel
view.
2. Probable loose bodies in the joint best seen on the standing
tunnel view.
3. In chondroma in the distal femur.

## 2021-08-06 ENCOUNTER — Ambulatory Visit: Payer: Self-pay | Admitting: Nurse Practitioner

## 2021-08-07 DIAGNOSIS — M1710 Unilateral primary osteoarthritis, unspecified knee: Secondary | ICD-10-CM | POA: Diagnosis not present

## 2021-08-07 DIAGNOSIS — I10 Essential (primary) hypertension: Secondary | ICD-10-CM | POA: Diagnosis not present

## 2021-08-10 ENCOUNTER — Encounter: Payer: Self-pay | Admitting: Adult Health

## 2021-08-10 ENCOUNTER — Ambulatory Visit: Payer: BC Managed Care – PPO | Admitting: Adult Health

## 2021-08-10 ENCOUNTER — Other Ambulatory Visit: Payer: Self-pay

## 2021-08-10 VITALS — BP 159/79 | HR 95 | Ht 68.0 in | Wt 256.0 lb

## 2021-08-10 DIAGNOSIS — I10 Essential (primary) hypertension: Secondary | ICD-10-CM | POA: Diagnosis not present

## 2021-08-10 DIAGNOSIS — G4700 Insomnia, unspecified: Secondary | ICD-10-CM | POA: Diagnosis not present

## 2021-08-10 DIAGNOSIS — I6381 Other cerebral infarction due to occlusion or stenosis of small artery: Secondary | ICD-10-CM | POA: Diagnosis not present

## 2021-08-10 DIAGNOSIS — Z09 Encounter for follow-up examination after completed treatment for conditions other than malignant neoplasm: Secondary | ICD-10-CM

## 2021-08-10 DIAGNOSIS — R29818 Other symptoms and signs involving the nervous system: Secondary | ICD-10-CM

## 2021-08-10 NOTE — Progress Notes (Signed)
[facility] [address] [place]. [place] [address] [phone]       HOSPITAL FOLLOW UP NOTE  [name] Date of Birth:  1961-05-22 Medical Record Number:  [id number]   Reason for Referral:  hospital stroke follow up    SUBJECTIVE:   CHIEF COMPLAINT:  Chief Complaint  Patient presents with   Follow-up    RM 2 here for hospital follow up; pt reports he has been doing well since since d/c. He does have some residual numbness on the right side.     HPI:   [name] is a 61 y.o. male with history of  ETOH abuse (6 drinks/day reported), BPH, HTN, OA,  and prostate cancer s/p robotic prostatectomy presented on 07/05/2021 with numbness to right arm and leg upon awakening.  Personally reviewed hospitalization pertinent progress notes, lab work and imaging.  Evaluated by Dr. Erlinda Hong for left thalamic infarct likely secondary to small vessel disease vs left PCA occlusion.  MRA showed distal left VA occlusion and distal left PCA stenosis/occlusion. Carotid Dopplers unremarkable.  EF 65 to 70%.  LDL 81.  A1c 5.3.  Recommended DAPT for 3 months and then aspirin alone due to L PCA stenosis vs distal occlusion as well as initiating atorvastatin 40 mg daily.  HTN stable on high end can recommend initiating amlodipine 2.5 mg daily at d/c.  Other stroke risk factors include EtOH use and obesity.  No prior stroke history. Per Dr. Erlinda Hong on exam, right sided paresthesia but otherwise neuro exam intact.  Evaluated by therapies who recommended outpatient PT and discharged home.   Today, 08/10/2021, patient being seen for initial hospital follow-up unaccompanied.  Overall stable since discharge without new stroke/TIA symptoms.  Reports continued imbalance and right-sided numbness (outer portion of right arm and leg) - does note some improvement. Denies any associated pain.  Eval by PT 2/10 - no therapy needs identified and provided HEP which he continues to work on.  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 He does have bilateral knee pain which is chronic and likely interfering some with balance, plans on seeing orthopedics to further discuss. Remains on both aspirin and Plavix as well as atorvastatin without side effects.  Blood pressure today 159/79. Routinely monitors at home and has been gradually improving - reports this AM 131/75.  Has f/u visit with PCP Dr. Ernie Hew this afternoon with plans on repeating lab work.  No further concerns at this time.     PERTINENT IMAGING  Per hospitalization 07/05/2021 Code Stroke sclerotic lesion in the left frontal bone is probably an osteoma MRI  Acute left thalamic infarct, mild chronic small vessel disease MRA  Left vertebral artery supplies PICA but is abnormal distal to that, either severely disease or occluded. Right vertebral artery supplies the basilar. Left PCA is abnormal with diminished filling of the more distal branch vessels. Right PCA is a large vessel it takes fetal origin from the anterior circulation. Carotid Doppler  unremarkable 2D Echo EF 65-70%, no regional wall abnormalities. No atrial level shunt LDL 81 HgbA1c 5.3       ROS:   14 system review of systems performed and negative with exception of those listed in HPI  PMH:  Past Medical History:  Diagnosis Date   Alcohol abuse    Cancer ([place])    Class 2 obesity 07/05/2021  Prostate cancer ([facility])     PSH:  Past Surgical History:  Procedure Laterality Date   APPENDECTOMY     PROSTATECTOMY     PROSTATECTOMY      Social History:  Social History   Socioeconomic History   Marital status: Single    Spouse name: Not on file   Number of children: Not on file   Years of education: Not on file   Highest education level: Not on file  Occupational History   Not on file  Tobacco Use   Smoking status: Never   Smokeless tobacco:  Never  Vaping Use   Vaping Use: Never used  Substance and Sexual Activity   Alcohol use: Yes   Drug use: Never   Sexual activity: Not on file  Other Topics Concern   Not on file  Social History Narrative   Not on file   Social Determinants of Health   Financial Resource Strain: Not on file  Food Insecurity: Not on file  Transportation Needs: Not on file  Physical Activity: Not on file  Stress: Not on file  Social Connections: Not on file  Intimate Partner Violence: Not on file    Family History:  Family History  Problem Relation Age of Onset   Cerebral aneurysm Mother    Cancer Father    Stomach cancer Brother    Pulmonary embolism Brother     Medications:   Current Outpatient Medications on File Prior to Visit  Medication Sig Dispense Refill   amLODipine (NORVASC) 2.5 MG tablet Take 1 tablet (2.5 mg total) by mouth daily. 30 tablet 0   aspirin EC 325 MG EC tablet Take 1 tablet (325 mg total) by mouth daily. 90 tablet 0   atorvastatin (LIPITOR) 40 MG tablet Take 1 tablet (40 mg total) by mouth daily. 90 tablet 0   clopidogrel (PLAVIX) 75 MG tablet Take 1 tablet (75 mg total) by mouth daily. 90 tablet 0   folic acid (FOLVITE) 1 MG tablet Take 1 tablet (1 mg total) by mouth daily.     Multiple Vitamin (MULTIVITAMIN WITH MINERALS) TABS tablet Take 1 tablet by mouth daily.     pantoprazole (PROTONIX) 40 MG tablet Take 1 tablet (40 mg total) by mouth daily. 90 tablet 0   thiamine 100 MG tablet Take 1 tablet (100 mg total) by mouth daily.     No current facility-administered medications on file prior to visit.    Allergies:  No Known Allergies    OBJECTIVE:  Physical Exam  Vitals:   08/10/21 0912  BP: (!) 159/79  Pulse: 95  Weight: 256 lb (116.1 kg)  Height: 5\' 8"  (1.727 m)   Body mass index is 38.92 kg/m. No results found.  Post stroke PHQ 2/9 Depression screen PHQ 2/9 08/10/2021  Decreased Interest 0  Down, Depressed, Hopeless 0  PHQ - 2 Score 0      General: well developed, well nourished, very pleasant middle-aged African-American male, seated, in no evident distress Head: head normocephalic and atraumatic.   Neck: supple with no carotid or supraclavicular bruits Cardiovascular: regular rate and rhythm, no murmurs Musculoskeletal: no deformity Skin:  no rash/petichiae Vascular:  Normal pulses all extremities   Neurologic Exam Mental Status: Awake and fully alert.  Fluent speech and language.  Oriented to place and time. Recent and remote memory intact. Attention span, concentration and fund of knowledge appropriate. Mood and affect appropriate.  Cranial Nerves: Fundoscopic exam reveals sharp disc margins. Pupils equal, briskly reactive to  light. Extraocular movements full without nystagmus. Visual fields full to confrontation. Hearing intact. Facial sensation intact. Face, tongue, palate moves normally and symmetrically.  Motor: Normal bulk and tone. Normal strength in all tested extremity muscles Sensory.: intact to touch , pinprick , position and vibratory sensation except subjective increased tingling with light touch on outer aspect of right arm (from hand to shoulder) and right leg (from foot to hip) Coordination: Rapid alternating movements normal in all extremities. Finger-to-nose and heel-to-shin performed accurately bilaterally. Gait and Station: Arises from chair without difficulty. Stance is normal. Gait demonstrates wide based gait with normal stride length and mild abnormality likely due to b/l knee pain. Tandem walk and heel toe with mild difficulty.  Romberg negative. Reflexes: 1+ and symmetric. Toes downgoing.     NIHSS  1 Modified Rankin  2      ASSESSMENT: Kyle Chang is a 61 y.o. year old male with left thalamic infarct on 07/05/2021 likely secondary to small vessel disease vs L PCA stenosis. Vascular risk factors include intracranial stenosis, HTN, HLD, EtOH use and obesity.      PLAN:  Left thalamic  stroke:  Residual deficit: imbalance and right sided paresthesias.  Discussed typical recovery time.  Continue HEP. Suspect some imbalance coming from b/l knee pain - plans on seeing ortho. Continue to follow with PCP for short-term disability Continue aspirin 325 mg daily and clopidogrel 75 mg daily for total of 3 months then aspirin alone and continue atorvastatin 40 mg daily for secondary stroke prevention.   Discussed secondary stroke prevention measures and importance of close PCP follow up for aggressive stroke risk factor management. I have gone over the pathophysiology of stroke, warning signs and symptoms, risk factors and their management in some detail with instructions to go to the closest emergency room for symptoms of concern. HTN: BP goal <130/90.  Stable at home on current regimen per PCP HLD: LDL goal <70. Recent LDL 81.  Continue atorvastatin 40 mg daily, managed by PCP. Plans on repeat lab work this afternoon     Follow up in 4 months or call earlier if needed - if remains stable, can f/u as needed   CC:  [place] provider: Dr. Leonie Man PCP: Fanny Bien, MD    I spent 57 minutes of face-to-face and non-face-to-face time with patient.  This included previsit chart review including review of recent hospitalization, lab review, study review, electronic health record documentation, patient education regarding recent stroke including etiology, secondary stroke prevention measures and importance of managing stroke risk factors, residual deficits and typical recovery time and answered all other questions to patient satisfaction  Frann Rider, AGNP-BC  [facility] [address] [place], [place] [address]  Phone [phone] Fax [phone] Note: This document was prepared with digital dictation and possible smart phrase technology. Any transcriptional errors that result from this process are unintentional.

## 2021-08-10 NOTE — Patient Instructions (Signed)
Continue aspirin 325 mg daily and clopidogrel 75 mg daily for total of 3 months (around end of April) then aspirin alone and continue atorvastatin 40 mg daily for secondary stroke prevention ? ?Continue to follow up with PCP regarding cholesterol and blood pressure management  ?Maintain strict control of hypertension with blood pressure goal below 130/90 and cholesterol with LDL cholesterol (bad cholesterol) goal below 70 mg/dL.  ? ?Signs of a Stroke? Follow the BEFAST method:  ?Balance Watch for a sudden loss of balance, trouble with coordination or vertigo ?Eyes Is there a sudden loss of vision in one or both eyes? Or double vision?  ?Face: Ask the person to smile. Does one side of the face droop or is it numb?  ?Arms: Ask the person to raise both arms. Does one arm drift downward? Is there weakness or numbness of a leg? ?Speech: Ask the person to repeat a simple phrase. Does the speech sound slurred/strange? Is the person confused ? ?Time: If you observe any of these signs, call 911. ? ? ? ? ? ?Followup in the future with me in 4 months or call earlier if needed ? ? ? ? ? ? ?Thank you for coming to see [place] at [facility]. I hope we have been able to provide you high quality care today. ? ?You may receive a patient satisfaction survey over the next few weeks. We would appreciate your feedback and comments so that we may continue to improve ourselves and the health of our patients. ? ?

## 2021-08-16 DIAGNOSIS — E782 Mixed hyperlipidemia: Secondary | ICD-10-CM | POA: Diagnosis not present

## 2021-08-16 DIAGNOSIS — I10 Essential (primary) hypertension: Secondary | ICD-10-CM | POA: Diagnosis not present

## 2021-08-16 DIAGNOSIS — I6930 Unspecified sequelae of cerebral infarction: Secondary | ICD-10-CM | POA: Diagnosis not present

## 2021-08-27 ENCOUNTER — Inpatient Hospital Stay: Payer: BC Managed Care – PPO | Admitting: Adult Health

## 2021-09-27 DIAGNOSIS — Z0289 Encounter for other administrative examinations: Secondary | ICD-10-CM

## 2021-10-03 ENCOUNTER — Telehealth: Payer: Self-pay | Admitting: *Deleted

## 2021-10-03 NOTE — Telephone Encounter (Signed)
Not able to reach pt. Per Janett Billow PCP should continue to fill out form. ?

## 2021-10-24 ENCOUNTER — Telehealth: Payer: Self-pay | Admitting: Adult Health

## 2021-10-24 NOTE — Telephone Encounter (Signed)
Rescheduled 7/3 appt with pt over the phone- Janett Billow out. ?

## 2021-12-03 ENCOUNTER — Encounter: Payer: Self-pay | Admitting: Adult Health

## 2021-12-03 ENCOUNTER — Ambulatory Visit: Payer: PRIVATE HEALTH INSURANCE | Admitting: Adult Health

## 2021-12-03 VITALS — BP 143/80 | HR 98 | Ht 68.0 in | Wt 252.0 lb

## 2021-12-03 DIAGNOSIS — I6381 Other cerebral infarction due to occlusion or stenosis of small artery: Secondary | ICD-10-CM | POA: Diagnosis not present

## 2021-12-10 ENCOUNTER — Ambulatory Visit: Payer: BC Managed Care – PPO | Admitting: Adult Health

## 2023-03-11 DEATH — deceased
